# Patient Record
Sex: Male | Born: 1990 | Race: Black or African American | Hispanic: No | Marital: Single | Smoking: Current some day smoker
Health system: Southern US, Community
[De-identification: ages and names within clinical notes are randomized; demographics above are authoritative.]

## PROBLEM LIST (undated history)

## (undated) DIAGNOSIS — M1990 Unspecified osteoarthritis, unspecified site: Secondary | ICD-10-CM

## (undated) DIAGNOSIS — F319 Bipolar disorder, unspecified: Secondary | ICD-10-CM

## (undated) HISTORY — PX: ANTERIOR CRUCIATE LIGAMENT REPAIR: SHX115

---

## 2014-10-01 ENCOUNTER — Encounter (HOSPITAL_COMMUNITY): Payer: Self-pay

## 2014-10-01 ENCOUNTER — Emergency Department (HOSPITAL_COMMUNITY)
Admission: EM | Admit: 2014-10-01 | Discharge: 2014-10-01 | Disposition: A | Discharge status: Other Acute Inpatient Hospital | Payer: Self-pay | Attending: Emergency Medicine | Admitting: Emergency Medicine

## 2014-10-01 ENCOUNTER — Encounter (HOSPITAL_COMMUNITY): Payer: Self-pay | Admitting: Emergency Medicine

## 2014-10-01 ENCOUNTER — Inpatient Hospital Stay (HOSPITAL_COMMUNITY)
Admission: AD | Admit: 2014-10-01 | Discharge: 2014-10-02 | DRG: 881 | Disposition: A | Discharge status: Home / Self Care | Payer: 59 | Source: Intra-hospital | Attending: Psychiatry | Admitting: Psychiatry

## 2014-10-01 DIAGNOSIS — Z599 Problem related to housing and economic circumstances, unspecified: Secondary | ICD-10-CM

## 2014-10-01 DIAGNOSIS — F332 Major depressive disorder, recurrent severe without psychotic features: Secondary | ICD-10-CM | POA: Insufficient documentation

## 2014-10-01 DIAGNOSIS — F329 Major depressive disorder, single episode, unspecified: Secondary | ICD-10-CM | POA: Diagnosis present

## 2014-10-01 DIAGNOSIS — F4323 Adjustment disorder with mixed anxiety and depressed mood: Secondary | ICD-10-CM | POA: Diagnosis present

## 2014-10-01 DIAGNOSIS — F411 Generalized anxiety disorder: Secondary | ICD-10-CM | POA: Diagnosis present

## 2014-10-01 DIAGNOSIS — F17200 Nicotine dependence, unspecified, uncomplicated: Secondary | ICD-10-CM | POA: Diagnosis present

## 2014-10-01 DIAGNOSIS — F410 Panic disorder [episodic paroxysmal anxiety] without agoraphobia: Secondary | ICD-10-CM | POA: Diagnosis present

## 2014-10-01 DIAGNOSIS — T39312A Poisoning by propionic acid derivatives, intentional self-harm, initial encounter: Secondary | ICD-10-CM | POA: Insufficient documentation

## 2014-10-01 DIAGNOSIS — T1491XA Suicide attempt, initial encounter: Secondary | ICD-10-CM | POA: Diagnosis present

## 2014-10-01 DIAGNOSIS — G4700 Insomnia, unspecified: Secondary | ICD-10-CM | POA: Diagnosis present

## 2014-10-01 DIAGNOSIS — Z23 Encounter for immunization: Secondary | ICD-10-CM | POA: Diagnosis not present

## 2014-10-01 DIAGNOSIS — F322 Major depressive disorder, single episode, severe without psychotic features: Secondary | ICD-10-CM | POA: Insufficient documentation

## 2014-10-01 DIAGNOSIS — F321 Major depressive disorder, single episode, moderate: Secondary | ICD-10-CM

## 2014-10-01 DIAGNOSIS — Z720 Tobacco use: Secondary | ICD-10-CM | POA: Insufficient documentation

## 2014-10-01 DIAGNOSIS — F1290 Cannabis use, unspecified, uncomplicated: Secondary | ICD-10-CM | POA: Diagnosis present

## 2014-10-01 DIAGNOSIS — F1210 Cannabis abuse, uncomplicated: Secondary | ICD-10-CM | POA: Insufficient documentation

## 2014-10-01 LAB — RAPID URINE DRUG SCREEN, HOSP PERFORMED
AMPHETAMINES: NOT DETECTED
Barbiturates: NOT DETECTED
Benzodiazepines: NOT DETECTED
COCAINE: NOT DETECTED
Opiates: NOT DETECTED
Tetrahydrocannabinol: POSITIVE — AB

## 2014-10-01 LAB — BASIC METABOLIC PANEL
ANION GAP: 15 (ref 5–15)
BUN: 22 mg/dL (ref 6–23)
CHLORIDE: 102 meq/L (ref 96–112)
CO2: 21 meq/L (ref 19–32)
CREATININE: 1.02 mg/dL (ref 0.50–1.35)
Calcium: 9.4 mg/dL (ref 8.4–10.5)
GFR calc Af Amer: >90 mL/min (ref >90)
GFR calc non Af Amer: >90 mL/min (ref >90)
Glucose, Bld: 95 mg/dL (ref 70–99)
Potassium: 3.6 mEq/L — ABNORMAL LOW (ref 3.7–5.3)
Sodium: 138 mEq/L (ref 137–147)

## 2014-10-01 LAB — ETHANOL: Alcohol, Ethyl (B): <11 mg/dL (ref 0–11)

## 2014-10-01 LAB — ACETAMINOPHEN LEVEL: Acetaminophen (Tylenol), Serum: <15 ug/mL (ref 10–30)

## 2014-10-01 LAB — CBC
HEMATOCRIT: 37.8 % — AB (ref 39.0–52.0)
Hemoglobin: 12.6 g/dL — ABNORMAL LOW (ref 13.0–17.0)
MCH: 21.6 pg — ABNORMAL LOW (ref 26.0–34.0)
MCHC: 33.3 g/dL (ref 30.0–36.0)
MCV: 64.8 fL — ABNORMAL LOW (ref 78.0–100.0)
Platelets: 233 10*3/uL (ref 150–400)
RBC: 5.83 MIL/uL — ABNORMAL HIGH (ref 4.22–5.81)
RDW: 15.9 % — AB (ref 11.5–15.5)
WBC: 7.7 10*3/uL (ref 4.0–10.5)

## 2014-10-01 LAB — SALICYLATE LEVEL: Salicylate Lvl: <2 mg/dL — ABNORMAL LOW (ref 2.8–20.0)

## 2014-10-01 MED ORDER — ACETAMINOPHEN 325 MG PO TABS: 650.0000 mg | ORAL_TABLET | Freq: Four times a day (QID) | ORAL | Status: DC | PRN

## 2014-10-01 MED ORDER — TRAZODONE HCL 50 MG PO TABS
50.0000 mg | ORAL_TABLET | Freq: Every evening | ORAL | Status: DC | PRN
  Administered 2014-10-01 – 2014-10-02 (×2): 50 mg via ORAL
  Filled 2014-10-01 (×6): qty 1

## 2014-10-01 MED ORDER — HYDROXYZINE HCL 25 MG PO TABS
25.0000 mg | ORAL_TABLET | Freq: Four times a day (QID) | ORAL | Status: DC | PRN
  Administered 2014-10-02: 25 mg via ORAL
  Filled 2014-10-01: qty 30
  Filled 2014-10-01: qty 1

## 2014-10-01 MED ORDER — INFLUENZA VAC SPLIT QUAD 0.5 ML IM SUSY
0.5000 mL | PREFILLED_SYRINGE | INTRAMUSCULAR | Status: AC
  Administered 2014-10-02: 0.5 mL via INTRAMUSCULAR
  Filled 2014-10-01: qty 0.5

## 2014-10-01 MED ORDER — LORAZEPAM 1 MG PO TABS
ORAL_TABLET | ORAL | Status: AC
  Filled 2014-10-01: qty 2

## 2014-10-01 MED ORDER — TRAZODONE HCL 50 MG PO TABS
ORAL_TABLET | ORAL | Status: AC
  Filled 2014-10-01: qty 1

## 2014-10-01 MED ORDER — MAGNESIUM HYDROXIDE 400 MG/5ML PO SUSP: 30.0000 mL | Freq: Every day | ORAL | Status: DC | PRN

## 2014-10-01 MED ORDER — LORAZEPAM 1 MG PO TABS
2.0000 mg | ORAL_TABLET | Freq: Once | ORAL | Status: AC
  Administered 2014-10-01: 2 mg via ORAL

## 2014-10-01 MED ORDER — ALUM & MAG HYDROXIDE-SIMETH 200-200-20 MG/5ML PO SUSP: 30.0000 mL | ORAL | Status: DC | PRN

## 2014-10-01 NOTE — BH Assessment (Signed)
Assessment Note  Adam Benton is an 23 y.o. male.  -Clinician talked with Sharen Hones, NP regarding need for TTS consult.  Patient had taken an overdose of 7 alieve tablets with ETOH and stated to police that he intended to kill himself.  Patient did admit to wanting to kill himself when he took the seven alieve tablets.  When asked if he was still feeling suicidal, patient says "sometimes."  He has been feeling very depressed for the last year.  Patient says that he gets upset with his baby's mother and her family.  "They blame everything on me."  Patient said that he doesn't want to harm anyone else, just himself.  Patient denies any A/V hallucinations.  Patient says he did drink "a fourth of a bottle" of ETOH when he took the alieve tablets.  He reports not normally drinking but 1-2 times per month and only a shot or so then.  Daily use of marijuana.  Usually smokes two joints per day.  Patient denies other drug use.  Patient has no current psychiatric provider and no previous inpatient care.  Patient has a court date coming up soon due to probation violation a few weeks ago.  Patient care was discussed with Donell Sievert, PA.  Patient has been accepted once his labs are back and reviewed again.  UDS is not taken yet.  Clinician informed Elsie Stain, NP of this disposition.  Axis I: 296.23 MDD Single episode, severe Axis II: Deferred Axis III: History reviewed. No pertinent past medical history. Axis IV: housing problems, occupational problems, problems related to legal system/crime and problems with primary support group Axis V: 31-40 impairment in reality testing  Past Medical History: History reviewed. No pertinent past medical history.  History reviewed. No pertinent past surgical history.  Family History: History reviewed. No pertinent family history.  Social History:  reports that he has been smoking.  He does not have any smokeless tobacco history on file. He reports that he  drinks alcohol. He reports that he uses illicit drugs (Marijuana).  Additional Social History:  Alcohol / Drug Use Pain Medications: None Prescriptions: None Over the Counter: N/A History of alcohol / drug use?: Yes Substance #1 Name of Substance 1: Marijuana 1 - Age of First Use: 23 years of age 9 - Amount (size/oz): Two joints per day 1 - Frequency: Daily use 1 - Duration: On-going 1 - Last Use / Amount: 10/27 Substance #2 Name of Substance 2: ETOH 2 - Age of First Use: 23 years of age 28 - Amount (size/oz): <1 shot on average1-2x/M 2 - Frequency: 1-2 times per month 2 - Duration: ongoing 2 - Last Use / Amount: 10/27 did drink "1/4 of a bottle."  CIWA: CIWA-Ar BP: 122/70 mmHg Pulse Rate: 94 COWS:    Allergies: No Known Allergies  Home Medications:  (Not in a hospital admission)  OB/GYN Status:  No LMP for male patient.  General Assessment Data Location of Assessment: [facility] ACT Assessment: Yes Is this a Tele or Face-to-Face Assessment?: Face-to-Face Is this an Initial Assessment or a Re-assessment for this encounter?: Initial Assessment Living Arrangements: Other relatives (PGM) Can pt return to current living arrangement?: Yes Admission Status: Voluntary Is patient capable of signing voluntary admission?: Yes Transfer from: Acute Hospital Referral Source: Self/Family/Friend     [facility] Crisis Care Plan Living Arrangements: Other relatives (PGM) Name of Psychiatrist: None Name of Therapist: NOne     Risk to self with the past 6 months Suicidal Ideation:  Yes-Currently Present Suicidal Intent: Yes-Currently Present Is patient at risk for suicide?: Yes Suicidal Plan?: Yes-Currently Present Specify Current Suicidal Plan: Overdose on OTC meds Access to Means: Yes Specify Access to Suicidal Means: OTC meds What has been your use of drugs/alcohol within the last 12 months?: THC daily Previous Attempts/Gestures: No How many times?: 0 Other Self Harm Risks:  None Triggers for Past Attempts: None known Intentional Self Injurious Behavior: None Family Suicide History: No Recent stressful life event(s): Conflict (Comment) (Conflict w/ baby mother's family and with her.) Persecutory voices/beliefs?: Yes Depression: Yes Depression Symptoms: Despondent;Insomnia;Isolating;Guilt;Loss of interest in usual pleasures;Feeling worthless/self pity Substance abuse history and/or treatment for substance abuse?: Yes Suicide prevention information given to non-admitted patients: Not applicable  Risk to Others within the past 6 months Homicidal Ideation: No Thoughts of Harm to Others: No Current Homicidal Intent: No Current Homicidal Plan: No Access to Homicidal Means: No Identified Victim: No one History of harm to others?: No Assessment of Violence: None Noted Violent Behavior Description: None Does patient have access to weapons?: No Criminal Charges Pending?: Yes Describe Pending Criminal Charges: Probation violation Does patient have a court date: Yes Court Date:  (Pt said his appearance is on 11/02)  Psychosis Hallucinations: None noted Delusions: None noted  Mental Status Report Appear/Hygiene: Unremarkable;In scrubs Eye Contact: Fair Motor Activity: Freedom of movement;Unremarkable Speech: Logical/coherent Level of Consciousness: Quiet/awake Mood: Depressed;Despair;Empty;Sad Affect: Anxious;Blunted;Sad Anxiety Level: Panic Attacks Panic attack frequency: 3-4x/W Most recent panic attack: A few days ago Thought Processes: Coherent;Relevant Judgement: Unimpaired Orientation: Person;Place;Time;Situation Obsessive Compulsive Thoughts/Behaviors: None  Cognitive Functioning Concentration: Decreased Memory: Recent Intact;Remote Intact IQ: Average Insight: Fair Impulse Control: Poor Appetite: Poor Weight Loss: 0 Weight Gain: 0 Sleep: Decreased Total Hours of Sleep:  (<4H/D) Vegetative Symptoms: Staying in bed  ADLScreening [facility]  Assessment Services) Patient's cognitive ability adequate to safely complete daily activities?: Yes Patient able to express need for assistance with ADLs?: Yes Independently performs ADLs?: Yes (appropriate for developmental age)  Prior Inpatient Therapy Prior Inpatient Therapy: No Prior Therapy Dates: None Prior Therapy Facilty/Provider(s): N/A Reason for Treatment: N/A  Prior Outpatient Therapy Prior Outpatient Therapy: No Prior Therapy Dates: N/A Prior Therapy Facilty/Provider(s): N/A Reason for Treatment: N/A  ADL Screening (condition at time of admission) Patient's cognitive ability adequate to safely complete daily activities?: Yes Is the patient deaf or have difficulty hearing?: No Does the patient have difficulty seeing, even when wearing glasses/contacts?: No Does the patient have difficulty concentrating, remembering, or making decisions?: No Patient able to express need for assistance with ADLs?: Yes Does the patient have difficulty dressing or bathing?: No Independently performs ADLs?: Yes (appropriate for developmental age) Does the patient have difficulty walking or climbing stairs?: No Weakness of Legs: None Weakness of Arms/Hands: None       Abuse/Neglect Assessment (Assessment to be complete while patient is alone) Physical Abuse: Denies Verbal Abuse: Denies Sexual Abuse: Denies Exploitation of patient/patient's resources: Denies Self-Neglect: Denies Values / Beliefs Cultural Requests During Hospitalization: None Spiritual Requests During Hospitalization: None   Advance Directives (For Healthcare) Does patient have an advance directive?: No Would patient like information on creating an advanced directive?: No - patient declined information    Additional Information 1:1 In Past 12 Months?: No CIRT Risk: No Elopement Risk: No Does patient have medical clearance?: Yes     Disposition:  Disposition Initial Assessment Completed for this Encounter:  Yes Disposition of Patient: Inpatient treatment program;Referred to Type of inpatient treatment program: Adult Patient referred to:  (  Pt meets inpatient criteria.per Donell SievertSpencer Simon)  On Site Evaluation by:   Reviewed with Physician:    Alexandria LodgeHarvey, [place] Espina Ray 10/01/2014 5:55 AM

## 2014-10-01 NOTE — ED Notes (Signed)
Pt transported from home after reports of taking 7 Aleve tablets @ 1 hour ago. Pt states to [employer] this was a suicide attempt. Pt is A & O. +etoh.

## 2014-10-01 NOTE — Tx Team (Signed)
Initial Interdisciplinary Treatment Plan   PATIENT STRESSORS: Educational concerns Medication change or noncompliance Substance abuse   PROBLEM LIST: Problem List/Patient Goals Date to be addressed Date deferred Reason deferred Estimated date of resolution  Depression with suicidal gesture                                                       DISCHARGE CRITERIA:  Improved stabilization in mood, thinking, and/or behavior Need for constant or close observation no longer present Verbal commitment to aftercare and medication compliance  PRELIMINARY DISCHARGE PLAN: Attend aftercare/continuing care group Outpatient therapy Return to previous work or school arrangements  PATIENT/FAMIILY INVOLVEMENT: This treatment plan has been presented to and reviewed with the patient, Adam Benton, and/or family member, .  The patient and family have been given the opportunity to ask questions and make suggestions.  Andrena Mewsuttall, Cova Knieriem J 10/01/2014, 11:26 PM

## 2014-10-01 NOTE — ED Notes (Signed)
Pt transported to [facility] by [facility] transportation service for continuation of specialized care. He left in no acute distress. 

## 2014-10-01 NOTE — ED Provider Notes (Signed)
CSN: [id number]     Arrival date & time 10/01/14  0122 History   First MD Initiated Contact with Patient 10/01/14 0129     Chief Complaint  Patient presents with  . Suicide Attempt     (Consider location/radiation/quality/duration/timing/severity/associated sxs/prior Treatment) HPI Comments: Has been arguing with his "babies momma' and thing got to be too much so he thought it would be better if he just ended it -- he had been during throughout the days and tonight took 7 Aleve tablets.  Denies previous Hx SI but states he has thought about it in the past   The history is provided by the patient.    History reviewed. No pertinent past medical history. History reviewed. No pertinent past surgical history. History reviewed. No pertinent family history. History  Substance Use Topics  . Smoking status: Current Some Day Smoker  . Smokeless tobacco: Not on file  . Alcohol Use: Yes     Comment: Socially    Review of Systems  Constitutional: Negative for fever.  Gastrointestinal: Negative for nausea and abdominal pain.  Neurological: Negative for dizziness and headaches.  Psychiatric/Behavioral: Positive for suicidal ideas.  All other systems reviewed and are negative.     Allergies  Review of patient's allergies indicates no known allergies.  Home Medications   Prior to Admission medications   Medication Sig Start Date End Date Taking? Authorizing Provider  naproxen sodium (ANAPROX) 220 MG tablet Take 1,540 mg by mouth once.   Yes Historical Provider, MD   BP 109/63  Pulse 64  Temp(Src) 98.3 F (36.8 C) (Oral)  Resp 18  Ht 5\' 9"  (1.753 m)  Wt 162 lb (73.483 kg)  BMI 23.91 kg/m2  SpO2 100% Physical Exam  Nursing note and vitals reviewed. Constitutional: He appears well-nourished.  HENT:  Head: Normocephalic.  Eyes: Pupils are equal, round, and reactive to light.  Neck: Normal range of motion.  Cardiovascular: Normal rate and regular rhythm.   Pulmonary/Chest:  Effort normal and breath sounds normal.  Abdominal: Soft. He exhibits no distension. There is no tenderness.  Musculoskeletal: Normal range of motion.  Neurological: He is alert.  Skin: Skin is warm. No rash noted.  Psychiatric: His speech is normal and behavior is normal. Cognition and memory are normal. He expresses impulsivity. He exhibits a depressed mood. He expresses suicidal ideation. He expresses suicidal plans.    ED Course  Procedures (including critical care time) Labs Review Labs Reviewed  CBC - Abnormal; Notable for the following:    RBC 5.83 (*)    Hemoglobin 12.6 (*)    HCT 37.8 (*)    MCV 64.8 (*)    MCH 21.6 (*)    RDW 15.9 (*)    All other components within normal limits  SALICYLATE LEVEL - Abnormal; Notable for the following:    Salicylate Lvl <2.0 (*)    All other components within normal limits  BASIC METABOLIC PANEL - Abnormal; Notable for the following:    Potassium 3.6 (*)    All other components within normal limits  URINE RAPID DRUG SCREEN (HOSP PERFORMED) - Abnormal; Notable for the following:    Tetrahydrocannabinol POSITIVE (*)    All other components within normal limits  ETHANOL  ACETAMINOPHEN LEVEL    Imaging Review No results found.   EKG Interpretation None      MDM   Final diagnoses:  MDD (major depressive disorder), severe  Suicide attempt         Arman FilterGail K Zohan Shiflet,  NP 10/01/14 1951

## 2014-10-01 NOTE — Consult Note (Signed)
Face to face evaluation and I agree with this note 

## 2014-10-01 NOTE — Progress Notes (Signed)
Pt is a 23 year old male admitted with depression after overdosing on seven alleve tablets    He reported that the stress of school and being a father was too much  He also said he smokes pot to help him not to feel suicidal and he didn't smoke any that day   He appears anxious and reports having poor sleep   Pt has not had treatment before   He is pleasant and cooperative   Admission assessment completed and nourishment offered   Q 15 min checks explained and implemented   Verbal support given  Administered medications as ordered  Discussed other alternatives to help sleep  Pt safe at present

## 2014-10-01 NOTE — ED Notes (Signed)
Bed: [id number] Expected date:  Expected time:  Means of arrival:  Comments: EMS Naproxen OD, SI

## 2014-10-01 NOTE — ED Notes (Signed)
Pt reports x4 months of depression and intermittent SI - pt denies any psych hx - admits he has been drinking liquor since yesterday approx 1800 and then took x7 200mg  Aleve in an attempt to overdose - pt states he got into an argument w/ someone yesterday evening and it carried over into today. Pt pleasant and cooperative - no acute distress.

## 2014-10-01 NOTE — Consult Note (Signed)
  Patient lying on bed.  Patient would not participate in interview.  After asking the patient several questions patient responded by shaking his head and saying "I don't want to talk right now."  Informed patient needed to answer the questions to that a plan could be made as far as his care.  Patient just sat staring.  Will attempt to assess at a later date.    Caylin Nass B. Lukas Pelcher FNP-BC

## 2014-10-02 DIAGNOSIS — F411 Generalized anxiety disorder: Secondary | ICD-10-CM

## 2014-10-02 DIAGNOSIS — F329 Major depressive disorder, single episode, unspecified: Secondary | ICD-10-CM | POA: Diagnosis present

## 2014-10-02 DIAGNOSIS — F4323 Adjustment disorder with mixed anxiety and depressed mood: Secondary | ICD-10-CM | POA: Diagnosis present

## 2014-10-02 MED ORDER — TRAZODONE HCL 50 MG PO TABS: 50.0000 mg | ORAL_TABLET | Freq: Every evening | ORAL | Status: AC | PRN

## 2014-10-02 MED ORDER — HYDROXYZINE HCL 25 MG PO TABS: 25.0000 mg | ORAL_TABLET | Freq: Four times a day (QID) | ORAL | Status: AC | PRN

## 2014-10-02 NOTE — Progress Notes (Signed)
[facility] :  Will you be returning to the same living situation after discharge: Yes,  patient will return home with his grandmother At discharge, do you have transportation home?:Yes,  patient reports access to transportation Do you have the ability to pay for your medications:Yes,  patient will be provided with medication samples and prescriptions at discharge.  Release of information consent forms completed and in the chart;  Patient's signature needed at discharge.  Patient to Follow up at: Follow-up Information   [facility] ([facility] this date or any time Monday-Friday between 8am to 3pm for assessment for therapy services. )    [facility]   Contact information:   [address] [place] [address] [phone]       Patient denies SI/HI:   Yes,  denies    Safety Planning and Suicide Prevention discussed:  Yes,  with patient and grandmother  Adam Benton, Adam Benton 10/02/2014, 1:57 PM

## 2014-10-02 NOTE — [facility] Suicide Risk Assessment (Signed)
Suicide Risk Assessment  Discharge Assessment     Demographic Factors:  Male and Adolescent or young adult  Total Time spent with patient: 45 minutes  Psychiatric Specialty Exam:     Blood pressure 105/58, pulse 86, temperature 97.5 F (36.4 C), temperature source Oral, resp. rate 18, height 5\' 8"  (1.727 m), weight 69.4 kg (153 lb), SpO2 100.00%.Body mass index is 23.27 kg/(m^2).  General Appearance: Fairly Groomed  Patent attorneyye Contact::  Fair  Speech:  Clear and Coherent  Volume:  Normal  Mood:  worries  Affect:  Appropriate  Thought Process:  Coherent and Goal Directed  Orientation:  Full (Time, Place, and Person)  Thought Content:  plans as he moves on  Suicidal Thoughts:  No  Homicidal Thoughts:  No  Memory:  Immediate;   Fair Recent;   Fair Remote;   Fair  Judgement:  Fair  Insight:  Present  Psychomotor Activity:  Normal  Concentration:  Fair  Recall:  FiservFair  Fund of Knowledge:NA  Language: Fair  Akathisia:  No  Handed:    AIMS (if indicated):     Assets:  Desire for Improvement Housing Physical Health Social Support Vocational/Educational  Sleep:  Number of Hours: 1.5    Musculoskeletal: Strength & Muscle Tone: within normal limits Gait & Station: normal Patient leans: N/A   Mental Status Per Nursing Assessment::   On Admission:     Current Mental Status by Physician: In full contact with reality. There are no active SI plans or intent. States he is ready to go back home. Looking forward to being with his daughter and getting back in school   Loss Factors: NA  Historical Factors: NA  Risk Reduction Factors:   Responsible for children under 718 years of age, Sense of responsibility to family, Living with another person, especially a relative and Positive social support  Continued Clinical Symptoms:  Depression:   Impulsivity  Cognitive Features That Contribute To Risk: none identified   Suicide Risk:  Minimal: No identifiable suicidal ideation.   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 No pertinent past medical history. AXIS IV:  other psychosocial or environmental problems AXIS V:  61-70 mild symptoms  Plan Of Care/Follow-up recommendations:  Activity:  as tolerated Diet:  regular Follow up outpatient basis Is patient on multiple antipsychotic therapies at discharge:  No   Has Patient had three or more failed trials of antipsychotic monotherapy by history:  No  Recommended Plan for Multiple Antipsychotic Therapies: NA    Adam Benton A 10/02/2014, 3:10 PM
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
## 2014-10-02 NOTE — [facility] Group Notes (Signed)
[facility] Group Notes:  (Nursing/MHT/Case Management/Adjunct)  Date:  10/02/2014  Time:  0900 am  Type of Therapy:  Psychoeducational Skills  Participation Level:  Minimal  Participation Quality:  Appropriate  Affect:  Appropriate  Cognitive:  Appropriate  Insight:  Appropriate and Good  Engagement in Group:  Limited  Modes of Intervention:  Support  Summary of Progress/Problems: Patient came into group late.  Cranford MonBeaudry, Zanasia Hickson Evans 10/02/2014, 12:59 PM
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
## 2014-10-02 NOTE — [facility] Suicide Risk Assessment (Signed)
[facility] INPATIENT:  Family/Significant Other Suicide Prevention Education  Suicide Prevention Education:  Education Completed; grandmother Gwendel HansonMae Brasher [phone],  (name of family member/significant other) has been identified by the patient as the family member/significant other with whom the patient will be residing, and identified as the person(s) who will aid the patient in the event of a mental health crisis (suicidal ideations/suicide attempt).  With written consent from the patient, the family member/significant other has been provided the following suicide prevention education, prior to the and/or following the discharge of the patient.  The suicide prevention education provided includes the following:  Suicide risk factors  Suicide prevention and interventions  National Suicide Hotline telephone number  [facility] assessment telephone number  [facility] [place] Emergency Assistance 911  [facility] and/or Residential Mobile Crisis Unit telephone number  Request made of family/significant other to:  Remove weapons (e.g., guns, rifles, knives), all items previously/currently identified as safety concern.    Remove drugs/medications (over-the-counter, prescriptions, illicit drugs), all items previously/currently identified as a safety concern.  The family member/significant other verbalizes understanding of the suicide prevention education information provided.  The family member/significant other agrees to remove the items of safety concern listed above.  Hadi Dubin, [place] CarboKristin L 10/02/2014, 1:55 PM

## 2014-10-02 NOTE — [facility] Group Notes (Signed)
[facility] LCSW Group Therapy  [facility] [place] [facility] 1:15 - 2:30 PM  10/02/2014   Type of Therapy:  Group Therapy  Participation Level: Active  Participation Quality:  Attentive  Affect:  Appropriate  Cognitive:  Appropriate  Insight:  Developing/Improving Engaged  Engagement in Therapy:  Developing/Improving   Modes of Intervention:  Discussion, Education, Exploration, Problem-Solving, Rapport Building, Support   Summary of Progress/Problems:   Patient was attentive to speaker from the Mental [facility] Association as he shared his story of dealing with mental health/substance abuse issues and overcoming it by working a recovery program.  Patient was very engaged in speaker's presentation and shook speaker hand and thanked him for coming.  Patient expressed interest in their programs and services and received information on their agency.    Adam Benton, Adam Benton 10/02/2014

## 2014-10-02 NOTE — Progress Notes (Signed)
Patient ID: Adam RiversShawn Benton, male   DOB: 03/10/1991, 23 y.o.   MRN: [id number] Nursing discharge note:  Patient discharged home per MD order.  Patient received all personal belongings, prescriptions and medication samples.  He denies any SI/HI/AVH.  Patient's discharge instructions, medications and follow up appointment reviewed with patient and he indicated understanding.  Patient left ambulatory with his girlfriend.

## 2014-10-02 NOTE — Clinical Social Work Note (Signed)
CSW attempted to contact patient's grandmother Gwendel HansonMae Estock [phone], left voicemail. Awaiting return call.  Samuella BruinKristin Falesha Schommer, MSW, [employer]CSWA Clinical Social Worker [facility] [phone]

## 2014-10-02 NOTE — Progress Notes (Signed)
D: Patient is alert and oriented. Pt's mood is "great." Pt's affect is appropriate and pleasant. Pt denies SI/HI and AVH at this time. Pt is attending groups. Pt reports his goal for the day is to be "happy" by "stay[ing] positive." Pt reports depression 0/10, hopelessness 6/10, and anxiety 10/10. A: Medications administered per providers orders (See MAR). 15 minute checks completed per protocol for patient safety. R: Pt receptive and cooperative to nursing interventions.

## 2014-10-03 NOTE — ED Provider Notes (Signed)
Medical screening examination/treatment/procedure(s) were conducted as a shared visit with non-physician practitioner(s) and myself.  I personally evaluated the patient during the encounter.  Pt with depression, SI. Also states took 5-7 aleve. No gi upset, no abd pain or nv. Labs. Psych team consulted. Pt alert, content, no resp dep or alt ms. Awaiting psych eval.    Suzi RootsKevin E Shakisha Abend, MD 10/03/14 [phone]
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
## 2014-10-07 NOTE — Progress Notes (Signed)
Patient Discharge Instructions:  After Visit Summary (AVS):   Faxed to:  10/07/14 Discharge Summary Note:   Faxed to:  10/07/14 Psychiatric Admission Assessment Note:   Faxed to:  10/07/14 Suicide Risk Assessment - Discharge Assessment:   Faxed to:  10/07/14 Faxed/Sent to the Next Level Care provider:  10/07/14 [facility] @ [phone]  Jerelene ReddenSheena E [place], 10/07/2014, 1:54 PM

## 2015-03-19 ENCOUNTER — Emergency Department (HOSPITAL_COMMUNITY): Payer: Self-pay

## 2015-03-19 ENCOUNTER — Encounter (HOSPITAL_COMMUNITY): Payer: Self-pay | Admitting: *Deleted

## 2015-03-19 ENCOUNTER — Emergency Department (HOSPITAL_COMMUNITY)
Admission: EM | Admit: 2015-03-19 | Discharge: 2015-03-19 | Disposition: A | Discharge status: Home / Self Care | Payer: Self-pay | Attending: Emergency Medicine | Admitting: Emergency Medicine

## 2015-03-19 DIAGNOSIS — Z720 Tobacco use: Secondary | ICD-10-CM | POA: Insufficient documentation

## 2015-03-19 DIAGNOSIS — M25461 Effusion, right knee: Secondary | ICD-10-CM | POA: Insufficient documentation

## 2015-03-19 DIAGNOSIS — M2550 Pain in unspecified joint: Secondary | ICD-10-CM

## 2015-03-19 DIAGNOSIS — R079 Chest pain, unspecified: Secondary | ICD-10-CM | POA: Insufficient documentation

## 2015-03-19 DIAGNOSIS — M25469 Effusion, unspecified knee: Secondary | ICD-10-CM

## 2015-03-19 LAB — HEPATIC FUNCTION PANEL
ALT: 13 U/L (ref 0–53)
AST: 18 U/L (ref 0–37)
Albumin: 4 g/dL (ref 3.5–5.2)
Alkaline Phosphatase: 52 U/L (ref 39–117)
Bilirubin, Direct: 0.1 mg/dL (ref 0.0–0.5)
Indirect Bilirubin: 0.3 mg/dL (ref 0.3–0.9)
Total Bilirubin: 0.4 mg/dL (ref 0.3–1.2)
Total Protein: 6.9 g/dL (ref 6.0–8.3)

## 2015-03-19 LAB — CBC WITH DIFFERENTIAL/PLATELET
Basophils Absolute: 0 K/uL (ref 0.0–0.1)
Basophils Relative: 0 % (ref 0–1)
Eosinophils Absolute: 0.2 K/uL (ref 0.0–0.7)
Eosinophils Relative: 3 % (ref 0–5)
HCT: 36.9 % — ABNORMAL LOW (ref 39.0–52.0)
Hemoglobin: 12.7 g/dL — ABNORMAL LOW (ref 13.0–17.0)
Lymphocytes Relative: 26 % (ref 12–46)
Lymphs Abs: 2 K/uL (ref 0.7–4.0)
MCH: 21.9 pg — ABNORMAL LOW (ref 26.0–34.0)
MCHC: 34.4 g/dL (ref 30.0–36.0)
MCV: 63.7 fL — ABNORMAL LOW (ref 78.0–100.0)
Monocytes Absolute: 0.9 K/uL (ref 0.1–1.0)
Monocytes Relative: 12 % (ref 3–12)
Neutro Abs: 4.4 K/uL (ref 1.7–7.7)
Neutrophils Relative %: 59 % (ref 43–77)
Platelets: 226 K/uL (ref 150–400)
RBC: 5.79 MIL/uL (ref 4.22–5.81)
RDW: 15.9 % — ABNORMAL HIGH (ref 11.5–15.5)
WBC: 7.5 K/uL (ref 4.0–10.5)

## 2015-03-19 LAB — GC/CHLAMYDIA PROBE AMP (~~LOC~~) NOT AT ARMC
Chlamydia: NEGATIVE
Neisseria Gonorrhea: NEGATIVE

## 2015-03-19 LAB — URINALYSIS, ROUTINE W REFLEX MICROSCOPIC
Bilirubin Urine: NEGATIVE
Glucose, UA: NEGATIVE mg/dL
Hgb urine dipstick: NEGATIVE
Ketones, ur: 15 mg/dL — AB
Leukocytes, UA: NEGATIVE
Nitrite: NEGATIVE
Protein, ur: NEGATIVE mg/dL
Specific Gravity, Urine: 1.035 — ABNORMAL HIGH (ref 1.005–1.030)
Urobilinogen, UA: 0.2 mg/dL (ref 0.0–1.0)
pH: 5.5 (ref 5.0–8.0)

## 2015-03-19 LAB — SYNOVIAL CELL COUNT + DIFF, W/ CRYSTALS
Crystals, Fluid: NONE SEEN
Eosinophils-Synovial: 0 % (ref 0–1)
Lymphocytes-Synovial Fld: 4 % (ref 0–20)
Monocyte-Macrophage-Synovial Fluid: 89 % (ref 50–90)
Neutrophil, Synovial: 7 % (ref 0–25)
WBC, Synovial: 5016 /mm3 — ABNORMAL HIGH (ref 0–200)

## 2015-03-19 LAB — BASIC METABOLIC PANEL WITH GFR
Anion gap: 11 (ref 5–15)
BUN: 11 mg/dL (ref 6–23)
CO2: 20 mmol/L (ref 19–32)
Calcium: 8.9 mg/dL (ref 8.4–10.5)
Chloride: 108 mmol/L (ref 96–112)
Creatinine, Ser: 0.85 mg/dL (ref 0.50–1.35)
GFR calc Af Amer: >90 mL/min
GFR calc non Af Amer: >90 mL/min
Glucose, Bld: 93 mg/dL (ref 70–99)
Potassium: 3.5 mmol/L (ref 3.5–5.1)
Sodium: 139 mmol/L (ref 135–145)

## 2015-03-19 LAB — HIV ANTIBODY (ROUTINE TESTING W REFLEX): HIV Screen 4th Generation wRfx: NONREACTIVE

## 2015-03-19 LAB — I-STAT TROPONIN, ED: Troponin i, poc: 0 ng/mL (ref 0.00–0.08)

## 2015-03-19 LAB — SEDIMENTATION RATE: Sed Rate: 4 mm/h (ref 0–16)

## 2015-03-19 MED ORDER — IBUPROFEN 800 MG PO TABS
800.0000 mg | ORAL_TABLET | Freq: Once | ORAL | Status: AC
  Administered 2015-03-19: 800 mg via ORAL
  Filled 2015-03-19: qty 2

## 2015-03-19 MED ORDER — IBUPROFEN 600 MG PO TABS: 600.0000 mg | ORAL_TABLET | Freq: Four times a day (QID) | ORAL | Status: DC | PRN

## 2015-03-19 MED ORDER — LIDOCAINE HCL (PF) 2 % IJ SOLN
10.0000 mL | Freq: Once | INTRAMUSCULAR | Status: AC
  Administered 2015-03-19: 10 mL
  Filled 2015-03-19: qty 10

## 2015-03-19 NOTE — ED Notes (Addendum)
Joint aspiration of left knee in process by Dr.Phifer

## 2015-03-19 NOTE — Discharge Instructions (Signed)
Arthralgia °Your caregiver has diagnosed you as suffering from an arthralgia. Arthralgia means there is pain in a joint. This can come from many reasons including: °· Bruising the joint which causes soreness (inflammation) in the joint. °· Wear and tear on the joints which occur as we grow older (osteoarthritis). °· Overusing the joint. °· Various forms of arthritis. °· Infections of the joint. °Regardless of the cause of pain in your joint, most of these different pains respond to anti-inflammatory drugs and rest. The exception to this is when a joint is infected, and these cases are treated with antibiotics, if it is a bacterial infection. °HOME CARE INSTRUCTIONS  °· Rest the injured area for as long as directed by your caregiver. Then slowly start using the joint as directed by your caregiver and as the pain allows. Crutches as directed may be useful if the ankles, knees or hips are involved. If the knee was splinted or casted, continue use and care as directed. If an stretchy or elastic wrapping bandage has been applied today, it should be removed and re-applied every 3 to 4 hours. It should not be applied tightly, but firmly enough to keep swelling down. Watch toes and feet for swelling, bluish discoloration, coldness, numbness or excessive pain. If any of these problems (symptoms) occur, remove the ace bandage and re-apply more loosely. If these symptoms persist, contact your caregiver or return to this location. °· For the first 24 hours, keep the injured extremity elevated on pillows while lying down. °· Apply ice for 15-20 minutes to the sore joint every couple hours while awake for the first half day. Then 03-04 times per day for the first 48 hours. Put the ice in a plastic bag and place a towel between the bag of ice and your skin. °· Wear any splinting, casting, elastic bandage applications, or slings as instructed. °· Only take over-the-counter or prescription medicines for pain, discomfort, or fever as  directed by your caregiver. Do not use aspirin immediately after the injury unless instructed by your physician. Aspirin can cause increased bleeding and bruising of the tissues. °· If you were given crutches, continue to use them as instructed and do not resume weight bearing on the sore joint until instructed. °Persistent pain and inability to use the sore joint as directed for more than 2 to 3 days are warning signs indicating that you should see a caregiver for a follow-up visit as soon as possible. Initially, a hairline fracture (break in bone) may not be evident on X-rays. Persistent pain and swelling indicate that further evaluation, non-weight bearing or use of the joint (use of crutches or slings as instructed), or further X-rays are indicated. X-rays may sometimes not show a small fracture until a week or 10 days later. Make a follow-up appointment with your own caregiver or one to whom we have referred you. A radiologist (specialist in reading X-rays) may read your X-rays. Make sure you know how you are to obtain your X-ray results. Do not assume everything is normal if you do not hear from us. °SEEK MEDICAL CARE IF: °Bruising, swelling, or pain increases. °SEEK IMMEDIATE MEDICAL CARE IF:  °· Your fingers or toes are numb or blue. °· The pain is not responding to medications and continues to stay the same or get worse. °· The pain in your joint becomes severe. °· You develop a fever over 102° F (38.9° C). °· It becomes impossible to move or use the joint. °MAKE SURE YOU:  °·   Understand these instructions.  Will watch your condition.  Will get help right away if you are not doing well or get worse. Document Released: 11/21/2005 Document Revised: 02/13/2012 Document Reviewed: 07/09/2008 [facility] Patient Information 2015 [place], [place]. This information is not intended to replace advice given to you by your health care provider. Make sure you discuss any questions you have with your health care  provider. Knee Effusion The medical term for having fluid in your knee is effusion. This is often due to an internal derangement of the knee. This means something is wrong inside the knee. Some of the causes of fluid in the knee may be torn cartilage, a torn ligament, or bleeding into the joint from an injury. Your knee is likely more difficult to bend and move. This is often because there is increased pain and pressure in the joint. The time it takes for recovery from a knee effusion depends on different factors, including:   Type of injury.  Your age.  Physical and medical conditions.  Rehabilitation Strategies. How long you will be away from your normal activities will depend on what kind of knee problem you have and how much damage is present. Your knee has two types of cartilage. Articular cartilage covers the bone ends and lets your knee bend and move smoothly. Two menisci, thick pads of cartilage that form a rim inside the joint, help absorb shock and stabilize your knee. Ligaments bind the bones together and support your knee joint. Muscles move the joint, help support your knee, and take stress off the joint itself. CAUSES  Often an effusion in the knee is caused by an injury to one of the menisci. This is often a tear in the cartilage. Recovery after a meniscus injury depends on how much meniscus is damaged and whether you have damaged other knee tissue. Small tears may heal on their own with conservative treatment. Conservative means rest, limited weight bearing activity and muscle strengthening exercises. Your recovery may take up to 6 weeks.  TREATMENT  Larger tears may require surgery. Meniscus injuries may be treated during arthroscopy. Arthroscopy is a procedure in which your surgeon uses a small telescope like instrument to look in your knee. Your caregiver can make a more accurate diagnosis (learning what is wrong) by performing an arthroscopic procedure. If your injury is on the  inner margin of the meniscus, your surgeon may trim the meniscus back to a smooth rim. In other cases your surgeon will try to repair a damaged meniscus with stitches (sutures). This may make rehabilitation take longer, but may provide better long term result by helping your knee keep its shock absorption capabilities. Ligaments which are completely torn usually require surgery for repair. HOME CARE INSTRUCTIONS  Use crutches as instructed.  If a brace is applied, use as directed.  Once you are home, an ice pack applied to your swollen knee may help with discomfort and help decrease swelling.  Keep your knee raised (elevated) when you are not up and around or on crutches.  Only take over-the-counter or prescription medicines for pain, discomfort, or fever as directed by your caregiver.  Your caregivers will help with instructions for rehabilitation of your knee. This often includes strengthening exercises.  You may resume a normal diet and activities as directed. SEEK MEDICAL CARE IF:   There is increased swelling in your knee.  You notice redness, swelling, or increasing pain in your knee.  An unexplained oral temperature above 102 F (38.9 C) develops.  SEEK IMMEDIATE MEDICAL CARE IF:   You develop a rash.  You have difficulty breathing.  You have any allergic reactions from medications you may have been given.  There is severe pain with any motion of the knee. MAKE SURE YOU:   Understand these instructions.  Will watch your condition.  Will get help right away if you are not doing well or get worse. Document Released: 02/11/2004 Document Revised: 02/13/2012 Document Reviewed: 04/16/2008 [facility] Patient Information 2015 [place], [place]. This information is not intended to replace advice given to you by your health care provider. Make sure you discuss any questions you have with your health care provider.  Emergency Department Resource Guide 1) Find a Doctor and Pay Out  of Pocket Although you won't have to find out who is covered by your insurance plan, it is a good idea to ask around and get recommendations. You will then need to call the office and see if the doctor you have chosen will accept you as a new patient and what types of options they offer for patients who are self-pay. Some doctors offer discounts or will set up payment plans for their patients who do not have insurance, but you will need to ask so you aren't surprised when you get to your appointment.  2) Contact Your Local Health Department Not all health departments have doctors that can see patients for sick visits, but many do, so it is worth a call to see if yours does. If you don't know where your local health department is, you can check in your phone book. The CDC also has a tool to help you locate your state's health department, and many state websites also have listings of all of their local health departments.  3) Find a Walk-in Clinic If your illness is not likely to be very severe or complicated, you may want to try a walk in clinic. These are popping up all over the country in pharmacies, drugstores, and shopping centers. They're usually staffed by nurse practitioners or physician assistants that have been trained to treat common illnesses and complaints. They're usually fairly quick and inexpensive. However, if you have serious medical issues or chronic medical problems, these are probably not your best option.  No Primary Care Doctor: - Call Health Connect at  [phone] - they can help you locate a primary care doctor that  accepts your insurance, provides certain services, etc. - Physician Referral Service- [phone]  Chronic Pain Problems: Organization         Address  Phone   Notes  [facility]  [phone] Patients need to be referred by their primary care doctor.   Medication Assistance: Organization         Address  Phone   Notes  [facility]  [facility] [address] [place] [place], [place] [address] [phone] --Must be a resident of [facility] -- Must have NO insurance coverage whatsoever (no Medicaid/ Medicare, etc.) -- The pt. MUST have a primary care doctor that directs their care regularly and follows them in the community   [facility]  [phone]   [employer]  [phone]    Agencies that provide inexpensive medical care: Organization         Address  Phone   Notes  [facility]  [phone]   [facility]    [phone]   [facility] [address] [place],  [place] [phone]   [facility] [address], [place] [phone]   Planned Parenthood    [phone]) [phone]   [facility]    [phone]   [facility]  [address], [place] Phone:  [phone](336) [phone], Fax:  [phone](336) [phone] Hours of Operation:  9 am - 6 pm, M-F.  Also accepts Medicaid/Medicare and self-pay.  [facility]  [address], [place] Phone: [phone](336) [phone], Fax: [phone]) [phone]. Hours of Operation:  8:30 am - 5:30 pm, M-F.  Also accepts Medicaid and self-pay.  [facility] [place] [address], [place] Phone: [phone]) [phone]   [facility] [address], [place], [place] [phone], Ext. [phone] Mondays & Thursdays: 7-9 AM.  First 15 patients are seen on a first come, first serve basis.    Medicaid-accepting [facility] Providers:  Organization         Address  Phone   Notes  [facility] [address], [place] [phone]) [phone] Also accepts self-pay patients.  [facility] [address], [place]  [phone]) [phone]   [facility] [address], [place] [phone]) [phone]   [facility] [address] [place]  [address], [place] [phone]) [phone]   Renaye RakersVeita Bland [address], [place]   [phone] Only accepts [place] Access [place] patients after they have their name applied to their card.   Self-Pay (no insurance) in [place] [facility]:  Organization         Address  Phone   Notes  Sickle Cell Patients, [facility] [address], [place] [phone]) [phone]   [facility] [address], [place] [phone]) [phone]   [facility]  [address], [place] [phone]   [facility]/Dr. Osei-Bonsu  [address] [place] [address], [place] or [phone], [place] [phone]) [phone] Phone number for both [place] and [place] locations is the same.  [facility] [address], [place] [phone]) [phone]   [facility] [address] [place] [address], [place] or [address] [phone]) [phone]   [facility] [address], [place] [phone](336) [phone], phone; [phone](336) [phone], fax Sees patients 1st and 3rd Saturday of every month.  Must not qualify for public or private insurance (i.e. Medicaid, Medicare, [place] Health Choice, Veterans' Benefits)  Household income should be no more than 200% of the poverty level The clinic cannot treat you if you are pregnant or think you are pregnant  Sexually transmitted diseases are not treated at the clinic.    Dental Care: Organization         Address  Phone  Notes  [facility] Department of [facility] [address], [place] [phone]) [phone] Accepts children up to age 25 who are enrolled in [place] or [place] Health Choice; pregnant women with a Medicaid card; and children who have applied for Medicaid or [place] Health Choice, but were declined, whose parents can pay a reduced fee at time of service.  [facility] of [facility] [place]  [address], [place]  [phone]) [phone] Accepts children up to age 25 who are enrolled in [place] or  Health Choice; pregnant  women with a Medicaid card; and children who have applied for Medicaid or [place] Health Choice, but were declined, whose parents can pay a reduced fee at time of service.  [facility]  [address] [place], [place] [phone] Patients are seen by appointment only. Walk-ins are not accepted. [facility] will see patients 30 years of age and older. Monday - Tuesday (8am-5pm) Most Wednesdays (8:30-5pm) $30 per visit, cash only  [facility] Adult Dental Access PRO[facility]  [address], [facility] [phone] Patients are seen by appointment only. Walk-ins are not accepted. [facility] will see patients 58 years of age and older. One Wednesday Evening (Monthly: Volunteer Based).  $30 per visit, cash only  [employer]  [phone] for adults; Children under age 61, call Graduate Pediatric Dentistry at [phone]. Children aged 64-14, please call [phone] to request a pediatric application.  Dental services are provided in all areas of dental care including fillings, crowns and bridges, complete and partial dentures, implants, gum treatment, root canals, and extractions. Preventive care is also provided. Treatment is provided to both adults and children. Patients are selected via a lottery and there is often a waiting list.   [facility] [address], [place]  [phone] [email or web address]   [facility] [address] [place], [place] [phone], Ext. [phone] Second and Fourth Thursday of each month, opens at 6:30 AM; Clinic ends at 9 AM.  Patients are seen on a first-come first-served basis, and a limited number are seen during each clinic.   [facility]  [address] [place], [place] [phone]   Eligibility Requirements You must have lived in [place], [place], or [place]  counties for at least the last three months.   You cannot be eligible for state or federal sponsored [place], including [employer], [place], or [employer].   You generally cannot be eligible for healthcare insurance through your employer.    How to apply: Eligibility screenings are held every Tuesday and Wednesday afternoon from 1:00 pm until 4:00 pm. You do not need an appointment for the interview!  [facility] [address] [place], [place] [phone]   [facility] Health Department  [phone]   [facility] Health Department  [phone]   [facility] Health Department  [phone]    Behavioral Health Resources in the Community: Intensive Outpatient Programs Organization         Address  Phone  Notes  [facility] [address] [place], [place] [phone]   [facility] [address] [place], [place] [phone]   [facility] [address] [place], [place]  [phone]   [facility] [address]  [place], [place] [phone] or [phone]   Substance Abuse Resources Organization         Address  Phone  Notes  [facility]  [phone]   [facility]  [phone]   [facility]  [phone]   Floydene Flock  [phone]   Residential & Outpatient Substance Abuse Program  [phone]   Psychological Services Organization         Address  Phone  Notes  [facility]  [phone]   [facility]  [phone]   [facility] [address] [place] [phone] or [phone]    Mobile Crisis Teams  Organization         Address  Phone  Notes  [facility], Mobile Crisis Care Unit  [phone]   [facility]  [address] [place], [place] [phone]   [facility] [address] [place]  [place] [phone]    Self-Help/Support Groups Organization         Address  Phone             Notes  [facility] [place] - variety of support groups  [phone]- I7437963 Call for more information  Narcotics Anonymous (NA), [facility] [address], [employer] [place]  2 meetings at this location   Statistician         Address  Phone  Notes  [facility] [address],    [place]  [phone]   [facility]  [address], [place] [address], [place], [place] [phone]   [facility] [address] [place], [place] [phone] Admissions: 8am-3pm M-F  [facility] [address],    [place], [place] [phone]   [facility] [address] [place], [place], [place] [phone]   [facility] [address] [place] [address]  [place], [place] [phone]   [facility] - Intensive Outpatient [address], [place], [place] [phone]   [facility] ([facility] [address] [place]  [place], [place] [phone] or [phone]   [facility] ([facility]) [address], [place], [place] [phone] Accepts Medicaid  [facility] [address]  [place] [phone] Substance Abuse/Addiction Treatment   [facility] Organization         Address  Phone  Notes  [facility]  [phone]   Angie Fava, PhD [address] [place], [place]   [phone] or [phone]   [facility] Behavioral   [address] [place], [place] [phone]   [facility] [address] [place], [place] [phone] Insurance/Medicaid/sponsorship through [facility] and Families [address]                                    [place], [place] [phone] Therapy/tele-psych/case  [facility] [address], [place] [phone]    Dr. Lolly Mustache  [phone]   [facility]  [employer] [facility] Dept. 1) [address] [place] 2) [address] [place] 3)  [address], [place] [phone]  [phone]   [facility] Child Abuse Hotline 442-603-4444 or [phone] (After Hours)

## 2015-03-19 NOTE — ED Notes (Signed)
Patient presents with c/o chest discomfort for the last 3-5 days, left knee pain

## 2015-03-19 NOTE — ED Provider Notes (Signed)
CSN: [id number]     Arrival date & time 03/19/15  0047 History   This chart was scribed for Arby BarretteMarcy Deny Chevez, MD by Evon Slackerrance Branch, ED Scribe. This patient was seen in room A05C/A05C and the patient's care was started at 2:06 AM.      Chief Complaint  Patient presents with  . Joint Pain  . Chest Pain   Patient is a 24 y.o. male presenting with chest pain. The history is provided by the patient. No language interpreter was used.  Chest Pain  HPI Comments: Adam Benton is a 24 y.o. male who presents to the Emergency Department complaining of CP onset 5 days prior. Pt reports right knee pain as well with associated swelling. Pt state that he has some arthralgias in his right hand that's worse when making a fist. Pt reports feeling fatigued and intermittent shortness of breath as well over the past year. Pt traces his symptoms to a right knee injury 1 year ago. Pt also reports bilateral hip pain and right ankle pain for the past 6 months. Pt denies new injury or trauma. Pt states that his ankle pain is worse when bearing weight. Pt denies fever, abdominal pain or other related symptoms. Pt denies any new medications other than naproxen that he no longer takes.    History reviewed. No pertinent past medical history. History reviewed. No pertinent past surgical history. No family history on file. History  Substance Use Topics  . Smoking status: Current Some Day Smoker  . Smokeless tobacco: Never Used  . Alcohol Use: Yes     Comment: Socially    Review of Systems  Cardiovascular: Positive for chest pain.   A complete 10 system review of systems was obtained and all systems are negative except as noted in the HPI and PMH.     Allergies  Peanut oil  Home Medications   Prior to Admission medications   Medication Sig Start Date End Date Taking? Authorizing Provider  hydrOXYzine (ATARAX/VISTARIL) 25 MG tablet Take 1 tablet (25 mg total) by mouth every 6 (six) hours as needed for  anxiety. Patient not taking: Reported on 03/19/2015 10/02/14   Adonis BrookSheila Agustin, NP  ibuprofen (ADVIL,MOTRIN) 600 MG tablet Take 1 tablet (600 mg total) by mouth every 6 (six) hours as needed. 03/19/15   Arby BarretteMarcy Lorita Forinash, MD  traZODone (DESYREL) 50 MG tablet Take 1 tablet (50 mg total) by mouth at bedtime and may repeat dose one time if needed. For insomnia and depression Patient not taking: Reported on 03/19/2015 10/02/14   Adonis BrookSheila Agustin, NP   BP 115/96 mmHg  Pulse 65  Temp(Src) 98.1 F (36.7 C) (Oral)  Resp 11  Ht 5\' 9"  (1.753 m)  Wt 162 lb (73.483 kg)  BMI 23.91 kg/m2  SpO2 100%   Physical Exam  Constitutional: He is oriented to person, place, and time. He appears well-developed and well-nourished. No distress.  HENT:  Head: Normocephalic and atraumatic.  Nose: Nose normal.  Mouth/Throat: Oropharynx is clear and moist.  Eyes: Conjunctivae and EOM are normal. No scleral icterus.  Neck: Neck supple. No tracheal deviation present.  Cardiovascular: Normal rate, regular rhythm, normal heart sounds and intact distal pulses.   Pulmonary/Chest: Effort normal and breath sounds normal. No respiratory distress. He has no wheezes. He has no rales. He exhibits tenderness.  Patient's chest was exquisitely tender to palpation. Particularly at the left costal sternal margins.  Abdominal: Soft. Bowel sounds are normal. He exhibits no distension and no mass. There is  no tenderness. There is no rebound and no guarding.  Musculoskeletal: Normal range of motion.  Patient has a moderate effusion at the left knee. There is not associated erythema. The patient is able to perform spontaneous range of motion with flexion and extension. He does report however deep flexion is painful. Both ankles and feet are normal appearance. Patient does endorse pain to palpation over the right first and second metacarpals. There does appear to be mild to moderate swelling in this area as well as some mild warmth. Other joints are  normal in appearance. There are no significant joint deviations or deformities.  Neurological: He is alert and oriented to person, place, and time. No cranial nerve deficit. He exhibits normal muscle tone. Coordination normal.  Skin: Skin is warm and dry. No rash noted.  Psychiatric: He has a normal mood and affect. His behavior is normal.  Nursing note and vitals reviewed.   ED Course  ARTHOCENTESIS Date/Time: 03/24/2015 1:28 AM Performed by: Arby Barrette Authorized by: Arby Barrette Consent: Verbal consent obtained. Risks and benefits: risks, benefits and alternatives were discussed Consent given by: patient Indications: joint swelling and diagnostic evaluation  Body area: knee Joint: left knee Local anesthesia used: yes Local anesthetic: lidocaine 1% without epinephrine Patient sedated: no Preparation: Patient was prepped and draped in the usual sterile fashion. Needle gauge: 18 G Ultrasound guidance: no Approach: lateral Aspirate: blood-tinged and yellow Aspirate amount: 50 mL   (including critical care time)  DIAGNOSTIC STUDIES: Oxygen Saturation is 100% on RA, normal by my interpretation.    COORDINATION OF CARE: 2:20 AM-Discussed treatment plan with pt at bedside and pt agreed to plan.     Labs Review Labs Reviewed  CBC WITH DIFFERENTIAL/PLATELET - Abnormal; Notable for the following:    Hemoglobin 12.7 (*)    HCT 36.9 (*)    MCV 63.7 (*)    MCH 21.9 (*)    RDW 15.9 (*)    All other components within normal limits  URINALYSIS, ROUTINE W REFLEX MICROSCOPIC - Abnormal; Notable for the following:    Color, Urine AMBER (*)    Specific Gravity, Urine 1.035 (*)    Ketones, ur 15 (*)    All other components within normal limits  SYNOVIAL CELL COUNT + DIFF, W/ CRYSTALS - Abnormal; Notable for the following:    Appearance-Synovial CLOUDY (*)    WBC, Synovial 5016 (*)    All other components within normal limits  CULTURE, BLOOD (ROUTINE X 2)  CULTURE, BLOOD  (ROUTINE X 2)  BODY FLUID CULTURE  GONOCOCCUS CULTURE  BASIC METABOLIC PANEL  HIV ANTIBODY (ROUTINE TESTING)  SEDIMENTATION RATE  HEPATIC FUNCTION PANEL  I-STAT TROPOININ, ED  GC/CHLAMYDIA PROBE AMP ([place])    Imaging Review No results found.   EKG Interpretation   Date/Time:  Thursday March 19 2015 00:52:48 EDT Ventricular Rate:  75 PR Interval:  138 QRS Duration: 84 QT Interval:  364 QTC Calculation: 406 R Axis:   94 Text Interpretation:  Normal sinus rhythm Rightward axis Septal infarct ,  age undetermined Abnormal ECG no STEMI. no old Confirmed by Donnald Garre, MD,  Lebron Conners [phone]) on 03/19/2015 6:18:19 AM      MDM   Final diagnoses:  Joint effusion of knee  Polyarthralgia        Arby Barrette, MD 03/24/15 [phone]

## 2015-03-19 NOTE — ED Provider Notes (Signed)
  Physical Exam  BP 102/74 mmHg  Pulse 78  Temp(Src) 98.1 F (36.7 C) (Oral)  Resp 23  Ht $R'5\' 9"'hT$  (1.753 m)  Wt 162 lb (73.483 kg)  BMI 23.91 kg/m2  SpO2 100%  Physical Exam  ED Course  Procedures  MDM Care assumed at sign out. Patient here with joint pain and has L knee effusion. Afebrile, well appearing. Sign out to follow up arthrocentesis result. There are no crystals, WBC count was 5000 with mostly monocytes. ESR nl. I think he can go home. Prescriptions written by previous provider.   Wandra Arthurs, MD 03/19/15 [phone]

## 2015-03-19 NOTE — ED Notes (Signed)
Lab called to inquire about remaining synovial cell count results.

## 2015-03-22 LAB — BODY FLUID CULTURE
CULTURE: NO GROWTH
SPECIAL REQUESTS: NORMAL

## 2015-03-22 LAB — GONOCOCCUS CULTURE
CULTURE: NO GROWTH
SPECIAL REQUESTS: NORMAL

## 2015-03-25 LAB — CULTURE, BLOOD (ROUTINE X 2)
Culture: NO GROWTH
Culture: NO GROWTH

## 2015-04-13 ENCOUNTER — Emergency Department
Admission: EM | Admit: 2015-04-13 | Discharge: 2015-04-14 | Disposition: A | Discharge status: Home / Self Care | Payer: 59 | Attending: Emergency Medicine | Admitting: Emergency Medicine

## 2015-04-13 ENCOUNTER — Encounter: Payer: Self-pay | Admitting: *Deleted

## 2015-04-13 DIAGNOSIS — F4323 Adjustment disorder with mixed anxiety and depressed mood: Secondary | ICD-10-CM | POA: Insufficient documentation

## 2015-04-13 DIAGNOSIS — Z9119 Patient's noncompliance with other medical treatment and regimen: Secondary | ICD-10-CM | POA: Insufficient documentation

## 2015-04-13 DIAGNOSIS — F3181 Bipolar II disorder: Secondary | ICD-10-CM

## 2015-04-13 DIAGNOSIS — Z720 Tobacco use: Secondary | ICD-10-CM | POA: Insufficient documentation

## 2015-04-13 LAB — CBC
HCT: 43.2 % (ref 40.0–52.0)
HEMOGLOBIN: 13.8 g/dL (ref 13.0–18.0)
MCH: 21.3 pg — AB (ref 26.0–34.0)
MCHC: 32 g/dL (ref 32.0–36.0)
MCV: 66.8 fL — AB (ref 80.0–100.0)
PLATELETS: 273 10*3/uL (ref 150–440)
RBC: 6.48 MIL/uL — AB (ref 4.40–5.90)
RDW: 17.1 % — AB (ref 11.5–14.5)
WBC: 6.5 10*3/uL (ref 3.8–10.6)

## 2015-04-13 LAB — COMPREHENSIVE METABOLIC PANEL
ALT: 14 U/L — AB (ref 17–63)
AST: 25 U/L (ref 15–41)
Albumin: 4.9 g/dL (ref 3.5–5.0)
Alkaline Phosphatase: 63 U/L (ref 38–126)
Anion gap: 9 (ref 5–15)
BILIRUBIN TOTAL: 0.7 mg/dL (ref 0.3–1.2)
BUN: 13 mg/dL (ref 6–20)
CO2: 24 mmol/L (ref 22–32)
Calcium: 9.4 mg/dL (ref 8.9–10.3)
Chloride: 107 mmol/L (ref 101–111)
Creatinine, Ser: 1.01 mg/dL (ref 0.61–1.24)
GFR calc Af Amer: >60 mL/min (ref >60)
GLUCOSE: 81 mg/dL (ref 65–99)
Potassium: 3.2 mmol/L — ABNORMAL LOW (ref 3.5–5.1)
SODIUM: 140 mmol/L (ref 135–145)
Total Protein: 9 g/dL — ABNORMAL HIGH (ref 6.5–8.1)

## 2015-04-13 LAB — URINE DRUG SCREEN, QUALITATIVE (ARMC ONLY)
Amphetamines, Ur Screen: NOT DETECTED
BARBITURATES, UR SCREEN: NOT DETECTED
Benzodiazepine, Ur Scrn: NOT DETECTED
Cannabinoid 50 Ng, Ur ~~LOC~~: POSITIVE — AB
Cocaine Metabolite,Ur ~~LOC~~: NOT DETECTED
MDMA (ECSTASY) UR SCREEN: NOT DETECTED
METHADONE SCREEN, URINE: NOT DETECTED
Opiate, Ur Screen: NOT DETECTED
Phencyclidine (PCP) Ur S: NOT DETECTED
Tricyclic, Ur Screen: NOT DETECTED

## 2015-04-13 LAB — SALICYLATE LEVEL: Salicylate Lvl: <4 mg/dL (ref 2.8–30.0)

## 2015-04-13 LAB — ACETAMINOPHEN LEVEL: Acetaminophen (Tylenol), Serum: <10 ug/mL — ABNORMAL LOW (ref 10–30)

## 2015-04-13 LAB — ETHANOL: ALCOHOL ETHYL (B): 30 mg/dL — AB (ref <5)

## 2015-04-13 NOTE — ED Notes (Signed)
Pt states since oct 2015 he has been feeling bad. Pt states today took a drive and has been thinking of hurting himself. Pt does not have a plan. Pt states he needs help to stop his thoughts

## 2015-04-13 NOTE — BH Assessment (Signed)
Assessment Note  Adam RiversShawn Benton is an 24 y.o. male, who presents to the ED stating, "[place]; I need to talk to someone; I analyzed my life; and my family is not there/here for me; I have not slept in 48 hrs; today, my baby's mother told me that, she cheated on me; and that, my daughter is not my daughter; I had so much anxiety; I tried to talk to my family; and nobody wants to listen; they everything is my fault; nobody listens to me; I'm not eating; I have been drinking more and smoking weed; that's the only way I can eat or sleep; I just needed to talk to somebody; back in 10 '2015; I took some pills and went to the ER; I stayed overnight."   Axis I: Major Depression, single episode Axis II: Deferred Axis III: No past medical history on file. Axis IV: other psychosocial or environmental problems, problems related to social environment and problems with access to health care services Axis V: 31-40 impairment in reality testing  Past Medical History: No past medical history on file.  No past surgical history on file.  Family History: No family history on file.  Social History:  reports that he has been smoking.  He has never used smokeless tobacco. He reports that he drinks alcohol. He reports that he uses illicit drugs (Marijuana).  Additional Social History:     CIWA: CIWA-Ar BP: 125/85 mmHg Pulse Rate: 63 COWS:    Allergies:  Allergies  Allergen Reactions  . Peanut Oil Swelling    Home Medications:  (Not in a hospital admission)  OB/GYN Status:  No LMP for male patient.  General Assessment Data [facility] Assessment: In system Is this a Tele or Face-to-Face Assessment?: Face-to-Face Is this an Initial Assessment or a Re-assessment for this encounter?: Initial Assessment Marital status: Single Is patient pregnant?: No Pregnancy Status: No Living Arrangements: Non-relatives/Friends Can pt return to current living  arrangement?: Yes Admission Status: Voluntary Is patient capable of signing voluntary admission?: Yes Referral Source: Self/Family/Friend Insurance type: [employer]  Medical Screening Exam [facility] Walk-[facility] ONLY) Medical Exam completed: Yes  Crisis Care Plan Living Arrangements: Non-relatives/Friends Name of Psychiatrist: none Name of Therapist: none  Education Status Is patient currently in school?: No Current Grade: n/a Highest grade of school patient has completed: 12th Name of school: n/a Contact person: friend  Risk to self with the past 6 months Suicidal Ideation: Yes-Currently Present Has patient been a risk to self within the past 6 months prior to admission? : No Suicidal Intent: No Has patient had any suicidal intent within the past 6 months prior to admission? : No Is patient at risk for suicide?: No (no) Suicidal Plan?: No Has patient had any suicidal plan within the past 6 months prior to admission? : No Access to Means: No What has been your use of drugs/alcohol within the last 12 months?: alcohol Previous Attempts/Gestures: No How many times?: 0 Other Self Harm Risks: depression; [place] of supports Triggers for Past Attempts: Family contact, Other personal contacts Intentional Self Injurious Behavior: None Family Suicide History: No Recent stressful life event(s): Conflict (Comment) (baby's mother) Persecutory voices/beliefs?: No Depression: Yes Depression Symptoms: Despondent, Tearfulness, Isolating Substance abuse history and/or treatment for substance abuse?: Yes Suicide prevention information given to non-admitted patients: Yes  Risk to Others within the past 6 months Homicidal Ideation: No Does patient have any lifetime risk of violence toward others beyond the  six months prior to admission? : No Thoughts of Harm to Others: No Current Homicidal Intent: No Current Homicidal Plan: No Access to Homicidal Means: No Identified Victim: none History of harm  to others?: No Assessment of Violence: On admission Violent Behavior Description: none Does patient have access to weapons?: No Criminal Charges Pending?: No Does patient have a court date: No Is patient on probation?: No  Psychosis Hallucinations: None noted Delusions: None noted  Mental Status Report Appearance/Hygiene: In scrubs Eye Contact: Good Motor Activity: Unremarkable Speech: Soft, Slow Level of Consciousness: Alert Mood: Helpless, Sad Affect: Depressed, Sad Anxiety Level: Minimal Thought Processes: Relevant, Coherent, Circumstantial Judgement: Partial Orientation: Person, Place, Time, Situation Obsessive Compulsive Thoughts/Behaviors: None  Cognitive Functioning Concentration: Good Memory: Recent Intact, Remote Intact IQ: Average Insight: Fair Impulse Control: Good Appetite: Fair Weight Loss: 0 Weight Gain: 0 Sleep: Decreased Total Hours of Sleep: 2 ("I have not slept in 48 hrs.) Vegetative Symptoms: None  ADLScreening [facility] Assessment Services) Patient's cognitive ability adequate to safely complete daily activities?: Yes Patient able to express need for assistance with ADLs?: Yes Independently performs ADLs?: Yes (appropriate for developmental age)  Prior Inpatient Therapy Prior Inpatient Therapy: No Prior Therapy Dates: noe Prior Therapy Facilty/Provider(s): none Reason for Treatment: none  Prior Outpatient Therapy Prior Outpatient Therapy: No Prior Therapy Dates: none Prior Therapy Facilty/Provider(s): none Reason for Treatment: none Does patient have an ACCT team?: No Does patient have Intensive In-House Services?  : No Does patient have [facility] services? : No Does patient have P4CC services?: No  ADL Screening (condition at time of admission) Patient's cognitive ability adequate to safely complete daily activities?: Yes Patient able to express need for assistance with ADLs?: Yes Independently performs ADLs?: Yes (appropriate for  developmental age)       Abuse/Neglect Assessment (Assessment to be complete while patient is alone) Physical Abuse: Denies Verbal Abuse: Denies Sexual Abuse: Denies Exploitation of patient/patient's resources: Denies Self-Neglect: Denies Values / Beliefs Cultural Requests During Hospitalization: None Spiritual Requests During Hospitalization: None Consults Spiritual Care Consult Needed: No Social Work Consult Needed: No Merchant navy officerAdvance Directives (For Healthcare) Does patient have an advance directive?: No Would patient like information on creating an advanced directive?: Yes English as a second language teacher- Educational materials given    Additional Information 1:1 In Past 12 Months?: No CIRT Risk: No Elopement Risk: No Does patient have medical clearance?: Yes  Child/Adolescent Assessment Running Away Risk: Denies Bed-Wetting: Denies Destruction of Property: Denies Cruelty to Animals: Denies Stealing: Denies Rebellious/Defies Authority: Denies Satanic Involvement: Denies Archivistire Setting: Denies Problems at Progress EnergySchool: Denies Gang Involvement: Denies  Disposition:  Disposition Initial Assessment Completed for this Encounter: Yes Disposition of Patient: Referred to (psych MD to see) Patient referred to: Other (Comment) (psych MD to see)  On Site Evaluation by:   Reviewed with Physician:    Dwan BoltMargaret Rosabell Geyer 04/13/2015 11:28 PM

## 2015-04-14 DIAGNOSIS — F3181 Bipolar II disorder: Secondary | ICD-10-CM

## 2015-04-14 NOTE — ED Notes (Signed)
BEHAVIORAL HEALTH ROUNDING Patient sleeping: No. Patient alert and oriented: yes Behavior appropriate: Yes.  ;  Nutrition and fluids offered: Yes  Toileting and hygiene offered: Yes  Sitter present: yes Law enforcement present: Yes  

## 2015-04-14 NOTE — ED Notes (Signed)
Pt. Noted sleeping in room. No complaints or concerns voiced. No distress or abnormal behavior noted. Will continue to monitor with security cameras. Q 15 minute rounds continue. 

## 2015-04-14 NOTE — ED Notes (Signed)
BEHAVIORAL HEALTH ROUNDING Patient sleeping: No. Patient alert and oriented: yes Behavior appropriate: Yes.  ; If no, describe:  Nutrition and fluids offered: Yes  Toileting and hygiene offered: Yes  Sitter present: q15 minute observations Law enforcement present: yes  ODS 

## 2015-04-14 NOTE — ED Notes (Signed)
Pt observed sitting in a recliner in the dayroom   NAD  Psych md is here and will be consulting soon

## 2015-04-14 NOTE — ED Notes (Signed)
Pt is sitting in a recliner in the dayroom   NAD observed  Pt to be discharged to home

## 2015-04-14 NOTE — ED Notes (Signed)
Lunch provided  Pt sitting in bed watching Tv  No verbalized concerns or complaints at this time  Psych consult pending

## 2015-04-14 NOTE — ED Notes (Signed)
Pt lying in bed with his eyes closed  Even, unlabored respirations observed  NAD  Continue to monitor

## 2015-04-14 NOTE — ED Notes (Signed)
No am meds ordered at this time   Assessment completed  No verbalized concerns or complaints at this time   Calm appropriate behavior observed  Denies pain

## 2015-04-14 NOTE — ED Notes (Signed)
Pt verbalizing concern over when the psychiatrist will come to see him  Pt is voluntary and he is expressing a desire to leave and make an appointment with the doctor  Pt informed that a consult is pending and the doctor will be coming in to talk with him  Encouraged him to be patient  Pt agrees to try

## 2015-04-14 NOTE — Discharge Instructions (Signed)
Adjustment Disorder  Follow-up as directed by the psychiatry service today. I recommend he [place] up with [facility] [place] tomorrow.  Return to the ER right away should you develop thoughts of wanting to harm yourself or others.  Most changes in life can cause stress. Getting used to changes may take a few months or longer. If feelings of stress, hopelessness, or worry continue, you may have an adjustment disorder. This stress-related mental health problem may affect your feelings, thinking and how you act. It occurs in both sexes and happens at any age. SYMPTOMS  Some of the following problems may be seen and vary from person to person:  Sadness or depression.  Loss of enjoyment.  Thoughts of suicide.  Fighting.  Avoiding family and friends.  Poor school performance.  Hopelessness, sense of loss.  Trouble sleeping.  Vandalism.  Worry, weight loss or gain.  Crying spells.  Anxiety  Reckless driving.  Skipping school.  Poor work International aid/development workerperformance.  Nervousness.  Ignoring bills.  Poor attitude. DIAGNOSIS  Your caregiver will ask what has happened in your life and do a physical exam. They will make a diagnosis of an adjustment disorder when they are sure another problem or medical illness causing your feelings does not exist. TREATMENT  When problems caused by stress interfere with you daily life or last longer than a few months, you may need counseling for an adjustment disorder. Early treatment may diminish problems and help you to better cope with the stressful events in your life. Sometimes medication is necessary. Individual counseling and or support groups can be very helpful. PROGNOSIS  Adjustment disorders usually last less than 3 to 6 months. The condition may persist if there is long lasting stress. This could include health problems, relationship problems, or job difficulties where you can not easily escape from what is causing the problem. PREVENTION  Even the  most mentally healthy, highly functioning people can suffer from an adjustment disorder given a significant blow from a life-changing event. There is no way to prevent pain and loss. Most people need help from time to time. You are not alone. SEEK MEDICAL CARE IF:  Your feelings or symptoms listed above do not improve or worsen. Document Released: 07/26/2006 Document Revised: 02/13/2012 Document Reviewed: 10/17/2007 [facility] Patient Information 2015 [place]. This information is not intended to replace advice given to you by your health care provider. Make sure you discuss any questions you have with your health care provider.

## 2015-04-14 NOTE — ED Notes (Signed)

## 2015-04-14 NOTE — ED Notes (Signed)
Pt lying in bed  NAD observed  

## 2015-04-14 NOTE — ED Notes (Signed)
ED BHU PLACEMENT JUSTIFICATION Is the patient under IVC or is there intent for IVC: No. Is the patient medically cleared: Yes.   Is there vacancy in the ED BHU: Yes.   Is the population mix appropriate for patient: Yes.   Is the patient awaiting placement in inpatient or outpatient setting: Yes.   Has the patient had a psychiatric consult: No.Survey of unit performed for contraband, proper placement and condition of furniture, tampering with fixtures in bathroom, shower, and each patient room: Yes.  ; Findings:  APPEARANCE/BEHAVIOR Calm cooperative   Appropriate to stimulation NEURO ASSESSMENT Orientation: oriented x3  Denies pain Hallucinations: No.None noted (Hallucinations) Speech: Normal Gait: normal RESPIRATORY ASSESSMENT Even unlabored respirations noted  CARDIOVASCULAR ASSESSMENT regular rate and rhythm, S1, S2 normal, no murmur, click, rub or gallop and regular rate  pulses equal  skin warm and dry  GASTROINTESTINAL ASSESSMENT no GI complaint EXTREMITIES Full ROM PLAN OF CARE Provide calm/safe environment. Vital signs assessed twice daily. ED BHU Assessment once each 12-hour shift. Collaborate with intake RN daily or as condition indicates. Assure the ED provider has rounded once each shift. Provide and encourage hygiene. Provide redirection as needed. Assess for escalating behavior; address immediately and inform ED provider.  Assess family dynamic and appropriateness for visitation as needed: Yes.  ; If necessary, describe findings:  Educate the patient/family about BHU procedures/visitation: Yes.  ; If necessary, describe findings:

## 2015-04-14 NOTE — ED Provider Notes (Signed)
-----------------------------------------   3:28 PM on 04/14/2015 -----------------------------------------  Appears to be adjustment disorder.  Seen and cleared for discharge by Dr. Toni Amendlapacs.  Patient will follow-up as an outpatient with psychiatric services, I have given him referral information to [facility].  Sharyn CreamerMark Quale, MD 04/14/15 [phone]

## 2015-04-14 NOTE — ED Provider Notes (Signed)
-----------------------------------------   7:26 AM on 04/14/2015 -----------------------------------------   BP 125/85 mmHg  Pulse 63  Temp(Src) 98.2 F (36.8 C) (Oral)  Resp 17  Ht 5\' 9"  (1.753 m)  Wt 162 lb (73.483 kg)  BMI 23.91 kg/m2  SpO2 100%  The patient had no acute events since last update.  Calm and cooperative at this time.  Disposition is pending per Psychiatry/Behavioral Medicine team recommendations.     Myrna Blazeravid Matthew Dontrel Smethers, MD 04/14/15 [phone]
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
## 2015-04-14 NOTE — ED Notes (Signed)
Pt. To BHU from ED ambulatory without difficulty, to room  . Report from [employer] RN. Pt. Is alert and oriented, warm and dry in no distress. Pt. Denies SI, HI, and AVH. Pt. Calm and cooperative. Pt. Made aware of security cameras and Q15 minute rounds. Pt. Encouraged to let Nursing staff know of any concerns or needs.

## 2015-04-14 NOTE — ED Notes (Addendum)
1/1 bags of belongings returned to him and he verbalized that he received back all belongings that he came here with  Discharge instructions reviewed with the pt and he verbalized agreement and understanding    Pt discharged with a script

## 2015-04-14 NOTE — ED Notes (Signed)
Breakfast provided  Pt rolled over and stated  "Thank you"  Pt informed that it could be after lunch before psych consult   Pt verbalized understanding  NAD assessed

## 2015-04-14 NOTE — ED Notes (Signed)
BEHAVIORAL HEALTH ROUNDING Patient sleeping: No. Patient alert and oriented: yes Behavior appropriate: Yes.  ; If no, describe:  Nutrition and fluids offered: Yes  Toileting and hygiene offered: Yes  Sitter present: q15 minute observations Law enforcement present: yes  ODS  [employer]

## 2015-04-14 NOTE — Consult Note (Signed)
[facility] Face-to-Face Psychiatry Consult   Reason for Consult:  Patient with a history of mood instability. Comes to the hospital because "the situation happened" Referring Physician:  schaevitz Patient Identification: Adam Benton MRN:  [id number] Principal Diagnosis: Bipolar 2 disorder Diagnosis:   Patient Active Problem List   Diagnosis Date Noted  . Bipolar 2 disorder [F31.81] 04/14/2015  . Major depression, single episode [F32.9] 10/02/2014  . Adjustment disorder with mixed anxiety and depressed mood [F43.23] 10/02/2014  . Suicide attempt [T14.91] 10/01/2014    Total Time spent with patient: 1 hour  Subjective:   Adam Benton is a 24 y.o. male patient admitted with patient reports "a situation happened". Says he got overwhelmed by a trivial situation and couldn't control his feelings. Feeling erratic for the last couple days.  HPI:  Patient presents stating that yesterday he got into a minor disagreement with his girlfriend. This caused him to get overwhelmed by emotions and feelings. Felt like his mind was racing. He says he never had any suicidal or homicidal ideation. He got in his car and drove from [place] over here to [place] looking for someone to talk to. He says that he is not currently taking any psychiatric medicine or getting any psychiatric treatment. He has felt under some stress recently because he is graduating from college doesn't have a job lined up yet. He uses marijuana on a nearly daily basis but says he is not using any other drugs and not drinking regularly. His sleep problems are erratic. Sometimes he will go for 2 or 3 days without sleep and at other times sleep for 2 or 3 days at a time. Appetite is up and down.  Patient has a history of one prior suicide attempt years ago. Has been prescribed medication in the past but doesn't remember what they were. Never followed up with outpatient treatment. Was in: Behavioral furl what looks like a very short stay in  the past under similar circumstances and was diagnosed with adjustment disorder.  Social history is that he is currently living by himself in [place]. 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 Hopes to get a job after that. Has a girlfriend with whom he has a child in [place] [place] and usually they have a pretty good relationship.  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 He still has some chronic pain and it blames that for a lot of his mood problems.  Current medications none  Family history: No known family history of mental health or substance abuse problems  Laboratory results positive for marijuana.   HPI Elements:   Quality:  Manic like symptoms with racing thoughts and feeling overwhelmed by his mood. Severity:  Moderate severity. Timing:  Worse over the last couple weeks especially when he is not sleeping. Duration:  Only lasted for a day or so to time. Context:  Graduating from college. Time of transition in his life..  Past Medical History: History reviewed. No pertinent past medical history. History reviewed. No pertinent past surgical history. Family History: No family history on file. Social History:  History  Alcohol Use  . Yes    Comment: Socially     History  Drug Use  . Yes  . Special: Marijuana    History   Social History  . Marital Status: Single    Spouse Name: N/A  . Number of Children: N/A  . Years of Education: N/A  Social History Main Topics  . Smoking status: Current Some Day Smoker  . Smokeless tobacco: Never Used  . Alcohol Use: Yes     Comment: Socially  . Drug Use: Yes    Special: Marijuana  . Sexual Activity: Not on file   Other Topics Concern  . None   Social History Narrative   Additional Social History:                          Allergies:   Allergies  Allergen Reactions  . Peanut Oil Swelling    Labs:  Results for orders placed or performed  during the hospital encounter of 04/13/15 (from the past 48 hour(s))  Acetaminophen level     Status: Abnormal   Collection Time: 04/13/15  7:41 PM  Result Value Ref Range   Acetaminophen (Tylenol), Serum <10 (L) 10 - 30 ug/mL    Comment:        THERAPEUTIC CONCENTRATIONS VARY SIGNIFICANTLY. A RANGE OF 10-30 ug/mL MAY BE AN EFFECTIVE CONCENTRATION FOR MANY PATIENTS. HOWEVER, SOME ARE BEST TREATED AT CONCENTRATIONS OUTSIDE THIS RANGE. ACETAMINOPHEN CONCENTRATIONS >150 ug/mL AT 4 HOURS AFTER INGESTION AND >50 ug/mL AT 12 HOURS AFTER INGESTION ARE OFTEN ASSOCIATED WITH TOXIC REACTIONS.   CBC     Status: Abnormal   Collection Time: 04/13/15  7:41 PM  Result Value Ref Range   WBC 6.5 3.8 - 10.6 K/uL   RBC 6.48 (H) 4.40 - 5.90 MIL/uL   Hemoglobin 13.8 13.0 - 18.0 g/dL   HCT 43.2 40.0 - 52.0 %   MCV 66.8 (L) 80.0 - 100.0 fL   MCH 21.3 (L) 26.0 - 34.0 pg   MCHC 32.0 32.0 - 36.0 g/dL   RDW 17.1 (H) 11.5 - 14.5 %   Platelets 273 150 - 440 K/uL  Comprehensive metabolic panel     Status: Abnormal   Collection Time: 04/13/15  7:41 PM  Result Value Ref Range   Sodium 140 135 - 145 mmol/L   Potassium 3.2 (L) 3.5 - 5.1 mmol/L   Chloride 107 101 - 111 mmol/L   CO2 24 22 - 32 mmol/L   Glucose, Bld 81 65 - 99 mg/dL   BUN 13 6 - 20 mg/dL   Creatinine, Ser 1.01 0.61 - 1.24 mg/dL   Calcium 9.4 8.9 - 10.3 mg/dL   Total Protein 9.0 (H) 6.5 - 8.1 g/dL   Albumin 4.9 3.5 - 5.0 g/dL   AST 25 15 - 41 U/L   ALT 14 (L) 17 - 63 U/L   Alkaline Phosphatase 63 38 - 126 U/L   Total Bilirubin 0.7 0.3 - 1.2 mg/dL   GFR calc non Af Amer >60 >60 mL/min   GFR calc Af Amer >60 >60 mL/min    Comment: (NOTE) The eGFR has been calculated using the CKD EPI equation. This calculation has not been validated in all clinical situations. eGFR's persistently <60 mL/min signify possible Chronic Kidney Disease.    Anion gap 9 5 - 15  Ethanol (ETOH)     Status: Abnormal   Collection Time: 04/13/15  7:41 PM   Result Value Ref Range   Alcohol, Ethyl (B) 30 (H) <5 mg/dL    Comment:        LOWEST DETECTABLE LIMIT FOR SERUM ALCOHOL IS 11 mg/dL FOR MEDICAL PURPOSES ONLY   Salicylate level     Status: None   Collection Time: 04/13/15  7:41 PM  Result Value Ref Range  Salicylate Lvl <1.6 2.8 - 30.0 mg/dL  Urine Drug Screen, Qualitative [facility])     Status: Abnormal   Collection Time: 04/13/15  8:32 PM  Result Value Ref Range   Tricyclic, Ur Screen NONE DETECTED NONE DETECTED   Amphetamines, Ur Screen NONE DETECTED NONE DETECTED   MDMA (Ecstasy)Ur Screen NONE DETECTED NONE DETECTED   Cocaine Metabolite,Ur [place] NONE DETECTED NONE DETECTED   Opiate, Ur Screen NONE DETECTED NONE DETECTED   Phencyclidine (PCP) Ur S NONE DETECTED NONE DETECTED   Cannabinoid 50 Ng, Ur [place] POSITIVE (A) NONE DETECTED   Barbiturates, Ur Screen NONE DETECTED NONE DETECTED   Benzodiazepine, Ur Scrn NONE DETECTED NONE DETECTED   Methadone Scn, Ur NONE DETECTED NONE DETECTED    Comment: (NOTE) 109  Tricyclics, urine               Cutoff 1000 ng/mL 200  Amphetamines, urine             Cutoff 1000 ng/mL 300  MDMA (Ecstasy), urine           Cutoff 500 ng/mL 400  Cocaine Metabolite, urine       Cutoff 300 ng/mL 500  Opiate, urine                   Cutoff 300 ng/mL 600  Phencyclidine (PCP), urine      Cutoff 25 ng/mL 700  Cannabinoid, urine              Cutoff 50 ng/mL 800  Barbiturates, urine             Cutoff 200 ng/mL 900  Benzodiazepine, urine           Cutoff 200 ng/mL 1000 Methadone, urine                Cutoff 300 ng/mL 1100 1200 The urine drug screen provides only a preliminary, unconfirmed 1300 analytical test result and should not be used for non-medical 1400 purposes. Clinical consideration and professional judgment should 1500 be applied to any positive drug screen result due to possible 1600 interfering substances. A more specific alternate chemical method 1700 must be used in order to obtain a confirmed  analytical result.  1800 Gas chromato graphy / mass spectrometry (GC/MS) is the preferred 1900 confirmatory method.     Vitals: Blood pressure 122/83, pulse 65, temperature 98.1 F (36.7 C), temperature source Oral, resp. rate 18, height $RemoveBe'5\' 9"'RhFaTHPpv$  (1.753 m), weight 73.483 kg (162 lb), SpO2 100 %.  Risk to Self: Suicidal Ideation: Yes-Currently Present Suicidal Intent: No Is patient at risk for suicide?: No (no) Suicidal Plan?: No Access to Means: No What has been your use of drugs/alcohol within the last 12 months?: alcohol How many times?: 0 Other Self Harm Risks: depression; [place] of supports Triggers for Past Attempts: Family contact, Other personal contacts Intentional Self Injurious Behavior: None Risk to Others: Homicidal Ideation: No Thoughts of Harm to Others: No Current Homicidal Intent: No Current Homicidal Plan: No Access to Homicidal Means: No Identified Victim: none History of harm to others?: No Assessment of Violence: On admission Violent Behavior Description: none Does patient have access to weapons?: No Criminal Charges Pending?: No Does patient have a court date: No Prior Inpatient Therapy: Prior Inpatient Therapy: No Prior Therapy Dates: noe Prior Therapy Facilty/Provider(s): none Reason for Treatment: none Prior Outpatient Therapy: Prior Outpatient Therapy: No Prior Therapy Dates: none Prior Therapy Facilty/Provider(s): none Reason for Treatment: none Does patient have an ACCT team?:  No Does patient have Intensive In-House Services?  : No Does patient have [facility] services? : No Does patient have P4CC services?: No  No current facility-administered medications for this encounter.   Current Outpatient Prescriptions  Medication Sig Dispense Refill  . hydrOXYzine (ATARAX/VISTARIL) 25 MG tablet Take 1 tablet (25 mg total) by mouth every 6 (six) hours as needed for anxiety. (Patient not taking: Reported on 03/19/2015) 30 tablet 0  . ibuprofen (ADVIL,MOTRIN)  600 MG tablet Take 1 tablet (600 mg total) by mouth every 6 (six) hours as needed. 30 tablet 0  . traZODone (DESYREL) 50 MG tablet Take 1 tablet (50 mg total) by mouth at bedtime and may repeat dose one time if needed. For insomnia and depression (Patient not taking: Reported on 03/19/2015) 30 tablet 0    Musculoskeletal: Strength & Muscle Tone: within normal limits Gait & Station: normal Patient leans: N/A  Psychiatric Specialty Exam: Physical Exam  Constitutional: He is oriented to person, place, and time. He appears well-developed and well-nourished.  HENT:  Head: Normocephalic and atraumatic.  Eyes: Pupils are equal, round, and reactive to light.  Neck: Normal range of motion.  Respiratory: Effort normal.  Musculoskeletal: Normal range of motion.  Neurological: He is alert and oriented to person, place, and time.  Skin: Skin is warm and dry.  Psychiatric: His speech is normal and behavior is normal. Judgment and thought content normal. His mood appears anxious. His affect is not angry. Cognition and memory are normal. He does not exhibit a depressed mood.    Review of Systems  Constitutional: Negative.   HENT: Negative.   Eyes: Negative.   Respiratory: Negative.   Cardiovascular: Negative.   Gastrointestinal: Negative.   Musculoskeletal: Negative.   Skin: Negative.   Psychiatric/Behavioral: Positive for substance abuse. Negative for depression, suicidal ideas, hallucinations and memory loss. The patient is nervous/anxious and has insomnia.     Blood pressure 122/83, pulse 65, temperature 98.1 F (36.7 C), temperature source Oral, resp. rate 18, height _0  (1.753 m), weight 73.483 kg (162 lb), SpO2 100 %.Body mass index is 23.91 kg/(m^2).  General Appearance: Negative  Eye Contact::  Good  Speech:  Clear and Coherent  Volume:  Normal  Mood:  Anxious  Affect:  Appropriate  Thought Process:  Negative  Orientation:  Full (Time, Place, and Person)  Thought Content:   Negative  Suicidal Thoughts:  No  Homicidal Thoughts:  No  Memory:  Immediate;   Good Recent;   Good Remote;   Good  Judgement:  Good  Insight:  Good  Psychomotor Activity:  Normal  Concentration:  Good  Recall:  Good  Fund of Knowledge:Good  Language: Good  Akathisia:  No  Handed:  Right  AIMS (if indicated):     Assets:  Desire for Improvement Financial Resources/Insurance Housing Intimacy Physical Health Social Support Talents/Skills  ADL's:  Intact  Cognition: WNL  Sleep:      Medical Decision Making: New problem, with additional work up planned, Review of Psycho-Social Stressors (1), Review or order clinical lab tests (1), Decision to obtain old records (1), Review and summation of old records (2) and Review of Medication Regimen & Side Effects (2)  Treatment Plan Summary: Plan Patient appears to have a little bit of racing thoughts but no obvious psychosis. Some of his history could be consistent with a hypomanic state. No evidence of acute dangerousness. Calm and cooperative now. Differential diagnosis includes the previously diagnosed adjustment disorder, substance-induced mood problems and possibly  bipolar disorder. I have a suspicion that he may ultimately proved to have a degree of bipolar disorder. At this point does not meet involuntary commitment criteria does not require hospitalization. Psychoeducation completed with the patient. Suggested that we start Seroquel 100 mg at night and that he follow-up with a provider in [facility]. We will make a referral to make sure he gets outpatient treatment. Patient agrees to the plan. Case discussed with emergency room doctor.  Plan:  Patient does not meet criteria for psychiatric inpatient admission. Supportive therapy provided about ongoing stressors. Discussed crisis plan, support from social network, calling 911, coming to the Emergency Department, and calling Suicide Hotline. Disposition: Patient will be released from  the hospital and can follow up in [facility].  Prescription for Seroquel 100 mg at night given.  CLAPACS, JOHN 04/14/2015 3:30 PM

## 2015-04-14 NOTE — ED Notes (Signed)
Pt to be discharged to home  Awaiting discharge paperwork to be completed

## 2015-09-25 ENCOUNTER — Encounter (HOSPITAL_COMMUNITY): Payer: Self-pay | Admitting: Emergency Medicine

## 2015-09-25 ENCOUNTER — Emergency Department (HOSPITAL_COMMUNITY)
Admission: EM | Admit: 2015-09-25 | Discharge: 2015-09-25 | Disposition: A | Discharge status: Home / Self Care | Payer: Self-pay | Attending: Emergency Medicine | Admitting: Emergency Medicine

## 2015-09-25 DIAGNOSIS — Z720 Tobacco use: Secondary | ICD-10-CM | POA: Insufficient documentation

## 2015-09-25 DIAGNOSIS — Y9389 Activity, other specified: Secondary | ICD-10-CM | POA: Insufficient documentation

## 2015-09-25 DIAGNOSIS — Z9889 Other specified postprocedural states: Secondary | ICD-10-CM | POA: Insufficient documentation

## 2015-09-25 DIAGNOSIS — Z0289 Encounter for other administrative examinations: Secondary | ICD-10-CM | POA: Insufficient documentation

## 2015-09-25 DIAGNOSIS — S93402A Sprain of unspecified ligament of left ankle, initial encounter: Secondary | ICD-10-CM

## 2015-09-25 DIAGNOSIS — S93492A Sprain of other ligament of left ankle, initial encounter: Secondary | ICD-10-CM | POA: Insufficient documentation

## 2015-09-25 DIAGNOSIS — X58XXXA Exposure to other specified factors, initial encounter: Secondary | ICD-10-CM | POA: Insufficient documentation

## 2015-09-25 DIAGNOSIS — Y998 Other external cause status: Secondary | ICD-10-CM | POA: Insufficient documentation

## 2015-09-25 DIAGNOSIS — Y9289 Other specified places as the place of occurrence of the external cause: Secondary | ICD-10-CM | POA: Insufficient documentation

## 2015-09-25 NOTE — ED Provider Notes (Signed)
CSN: [id number]     Arrival date & time 09/25/15  1417 History   First MD Initiated Contact with Patient 09/25/15 1426     Chief Complaint  Patient presents with  . Ankle Pain     (Consider location/radiation/quality/duration/timing/severity/associated sxs/prior Treatment) Patient is a 24 y.o. male presenting with ankle pain. The history is provided by the patient and medical records. No language interpreter was used.  Ankle Pain 24 yo AAM presents with ankle pain which began two days ago when he rolled his ankle. He describes the pain as throbbing and admits to swelling of the left ankle as well. Worse with movement and weight-bearing, alleviated with rest and pain medication. He was seen yesterday in the urgent care where he received x-rays and was told they were negative and he had an ankle sprain. He received pain medication as well. Today, he did not take the pain medication because he intended to go to work, however throughout the morning his pain intensified and he did not go to work.   History reviewed. No pertinent past medical history. Past Surgical History  Procedure Laterality Date  . Anterior cruciate ligament repair     No family history on file. Social History  Substance Use Topics  . Smoking status: Current Some Day Smoker  . Smokeless tobacco: Never Used  . Alcohol Use: Yes     Comment: Socially    Review of Systems  Constitutional: Negative.   Respiratory: Negative for chest tightness and shortness of breath.   Cardiovascular: Negative.   Musculoskeletal: Positive for joint swelling.  Skin: Negative.       Allergies  Peanut oil  Home Medications   Prior to Admission medications   Medication Sig Start Date End Date Taking? Authorizing Provider  hydrOXYzine (ATARAX/VISTARIL) 25 MG tablet Take 1 tablet (25 mg total) by mouth every 6 (six) hours as needed for anxiety. Patient not taking: Reported on 03/19/2015 10/02/14   Adonis Brook, NP  ibuprofen  (ADVIL,MOTRIN) 600 MG tablet Take 1 tablet (600 mg total) by mouth every 6 (six) hours as needed. 03/19/15   Arby Barrette, MD  traZODone (DESYREL) 50 MG tablet Take 1 tablet (50 mg total) by mouth at bedtime and may repeat dose one time if needed. For insomnia and depression Patient not taking: Reported on 03/19/2015 10/02/14   Adonis Brook, NP   BP 103/80 mmHg  Pulse 62  Temp(Src) 98 F (36.7 C) (Oral)  Resp 16  Ht  (1.753 m)  Wt 171 lb (77.565 kg)  BMI 25.24 kg/m2  SpO2 100% Physical Exam  Constitutional: He is oriented to person, place, and time. He appears well-developed and well-nourished.  HENT:  Head: Normocephalic and atraumatic.  Neck: Normal range of motion. Neck supple.  Cardiovascular: Normal rate, regular rhythm, normal heart sounds and intact distal pulses.  Exam reveals no gallop and no friction rub.   No murmur heard. Pulmonary/Chest: Effort normal and breath sounds normal. No respiratory distress. He has no wheezes. He has no rales. He exhibits no tenderness.  Abdominal: Soft. Bowel sounds are normal. He exhibits no distension and no mass. There is no tenderness. There is no rebound and no guarding.  Musculoskeletal:       Right shoulder: He exhibits decreased range of motion, tenderness and swelling. He exhibits normal strength.  Decreased ROM of left ankle secondary to pain Tenderness and swelling along the ATFL  No bruising.  5/5 muscle strength of left LE; neurovascularly intact  Neurological: He  is oriented to person, place, and time.  Skin: Skin is warm and dry. No rash noted. No erythema. No pallor.    ED Course  Procedures (including critical care time) Labs Review Labs Reviewed - No data to display  Imaging Review No results found. I have personally reviewed and evaluated these images and lab results as part of my medical decision-making.   EKG Interpretation None      MDM   Final diagnoses:  Ankle sprain, left, initial encounter    Douglass RiversShawn Ungar presents with ankle pain and recent visit to urgent care on 09/24/15 where he was x-rayed and diagnosed with an ankle sprain. Physical exam findings are consistent with an ankle sprain. Patient is requesting a work note for today which was given. He was also advised to rest, ice, and elevate the area.     [facility] HospitalJaime Pilcher Sukhdeep Wieting, PA-C 09/25/15 1524  Jerelyn ScottMartha Linker, MD 09/25/15 [phone]

## 2015-09-25 NOTE — ED Notes (Signed)
Pt from home for eval of pain to left ankle, pt injured ankle at work yesterday and [facility] Pt states xray were done and was sent home with brace and pain meds. Pt states pain is worse than yesterday, denies any new swelling. Pt ambulatory to fast track, states had to call out and was told needed proof that he couldn't go to work due to pain. Pulses present.

## 2015-09-25 NOTE — Discharge Instructions (Signed)
Return to clinic if worsening pain or any additional concerns.  Rest and elevate the leg, ice affected area for 30 minutes 3-4 times per day until pain resides.   Ankle Sprain An ankle sprain is an injury to the strong, fibrous tissues (ligaments) that hold the bones of your ankle joint together.  CAUSES An ankle sprain is usually caused by a fall or by twisting your ankle. Ankle sprains most commonly occur when you step on the outer edge of your foot, and your ankle turns inward. People who participate in sports are more prone to these types of injuries.  SYMPTOMS   Pain in your ankle. The pain may be present at rest or only when you are trying to stand or walk.  Swelling.  Bruising. Bruising may develop immediately or within 1 to 2 days after your injury.  Difficulty standing or walking, particularly when turning corners or changing directions. DIAGNOSIS  Your caregiver will ask you details about your injury and perform a physical exam of your ankle to determine if you have an ankle sprain. During the physical exam, your caregiver will press on and apply pressure to specific areas of your foot and ankle. Your caregiver will try to move your ankle in certain ways. An X-ray exam may be done to be sure a bone was not broken or a ligament did not separate from one of the bones in your ankle (avulsion fracture).  TREATMENT  Certain types of braces can help stabilize your ankle. Your caregiver can make a recommendation for this. Your caregiver may recommend the use of medicine for pain. If your sprain is severe, your caregiver may refer you to a surgeon who helps to restore function to parts of your skeletal system (orthopedist) or a physical therapist. HOME CARE INSTRUCTIONS   Apply ice to your injury for 1-2 days or as directed by your caregiver. Applying ice helps to reduce inflammation and pain.  Put ice in a plastic bag.  Place a towel between your skin and the bag.  Leave the ice on for  15-20 minutes at a time, every 2 hours while you are awake.  Only take over-the-counter or prescription medicines for pain, discomfort, or fever as directed by your caregiver.  Elevate your injured ankle above the level of your heart as much as possible for 2-3 days.  If your caregiver recommends crutches, use them as instructed. Gradually put weight on the affected ankle. Continue to use crutches or a cane until you can walk without feeling pain in your ankle.  If you have a plaster splint, wear the splint as directed by your caregiver. Do not rest it on anything harder than a pillow for the first 24 hours. Do not put weight on it. Do not get it wet. You may take it off to take a shower or bath.  You may have been given an elastic bandage to wear around your ankle to provide support. If the elastic bandage is too tight (you have numbness or tingling in your foot or your foot becomes cold and blue), adjust the bandage to make it comfortable.  If you have an air splint, you may blow more air into it or let air out to make it more comfortable. You may take your splint off at night and before taking a shower or bath. Wiggle your toes in the splint several times per day to decrease swelling. SEEK MEDICAL CARE IF:   You have rapidly increasing bruising or swelling.  Your  toes feel extremely cold or you lose feeling in your foot.  Your pain is not relieved with medicine. SEEK IMMEDIATE MEDICAL CARE IF:  Your toes are numb or blue.  You have severe pain that is increasing. MAKE SURE YOU:   Understand these instructions.  Will watch your condition.  Will get help right away if you are not doing well or get worse.   This information is not intended to replace advice given to you by your health care provider. Make sure you discuss any questions you have with your health care provider.   Document Released: 11/21/2005 Document Revised: 12/12/2014 Document Reviewed: 12/03/2011 Elsevier  Interactive Patient Education [employer]

## 2015-11-20 ENCOUNTER — Encounter (HOSPITAL_COMMUNITY): Payer: Self-pay | Admitting: Emergency Medicine

## 2015-11-20 ENCOUNTER — Emergency Department (HOSPITAL_COMMUNITY)
Admission: EM | Admit: 2015-11-20 | Discharge: 2015-11-20 | Disposition: A | Discharge status: Home / Self Care | Payer: BLUE CROSS/BLUE SHIELD | Attending: Emergency Medicine | Admitting: Emergency Medicine

## 2015-11-20 DIAGNOSIS — Z5689 Other problems related to employment: Secondary | ICD-10-CM

## 2015-11-20 DIAGNOSIS — F17200 Nicotine dependence, unspecified, uncomplicated: Secondary | ICD-10-CM | POA: Insufficient documentation

## 2015-11-20 DIAGNOSIS — Z8739 Personal history of other diseases of the musculoskeletal system and connective tissue: Secondary | ICD-10-CM | POA: Diagnosis not present

## 2015-11-20 DIAGNOSIS — Z0289 Encounter for other administrative examinations: Secondary | ICD-10-CM | POA: Insufficient documentation

## 2015-11-20 DIAGNOSIS — Z8659 Personal history of other mental and behavioral disorders: Secondary | ICD-10-CM | POA: Diagnosis not present

## 2015-11-20 HISTORY — DX: Bipolar disorder, unspecified: F31.9

## 2015-11-20 HISTORY — DX: Unspecified osteoarthritis, unspecified site: M19.90
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
## 2015-11-20 NOTE — ED Notes (Signed)
Adam Benton, Pharm Tech, spoke with patient.  Unable to verify all of patient's medication, speaking with NP at this time

## 2015-11-20 NOTE — ED Provider Notes (Signed)
CSN: [id number]     Arrival date & time 11/20/15  1723 History  By signing my name below, I, Lyndel Safe, attest that this documentation has been prepared under the direction and in the presence of Kerrie Buffalo, NP.  Electronically Signed: Lyndel Safe, ED Scribe. 11/20/2015. 5:52 PM.   Chief Complaint  Patient presents with  . Follow-up   The history is provided by the patient. No language interpreter was used.   HPI Comments: Kayson Tasker is a 24 y.o. male, with a hx of bipolar disorder and arthritis, who presents to the Emergency Department for a work note. 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 Pt takes trazodone, norco, and a bipolar medication daily, but he is unsure of the name of the mood stabilizing medication. He has no complaints today. Denies SI/HI. Pt afebrile.   Past Medical History  Diagnosis Date  . Bipolar 1 disorder (HCC)   . Arthritis    Past Surgical History  Procedure Laterality Date  . Anterior cruciate ligament repair     History reviewed. No pertinent family history. Social History  Substance Use Topics  . Smoking status: Current Some Day Smoker  . Smokeless tobacco: Never Used  . Alcohol Use: Yes     Comment: Socially    Review of Systems  Constitutional: Negative for fever.  Psychiatric/Behavioral: Negative for suicidal ideas and self-injury.  All other systems reviewed and are negative.  Allergies  Peanut oil  Home Medications   Prior to Admission medications   Medication Sig Start Date End Date Taking? Authorizing Provider  hydrOXYzine (ATARAX/VISTARIL) 25 MG tablet Take 1 tablet (25 mg total) by mouth every 6  (six) hours as needed for anxiety. Patient not taking: Reported on 03/19/2015 10/02/14   Adonis Brook, NP  ibuprofen (ADVIL,MOTRIN) 600 MG tablet Take 1 tablet (600 mg total) by mouth every 6 (six) hours as needed. Patient not taking: Reported on 11/20/2015 03/19/15   Arby Barrette, MD  traZODone (DESYREL) 50 MG tablet Take 1 tablet (50 mg total) by mouth at bedtime and may repeat dose one time if needed. For insomnia and depression Patient not taking: Reported on 03/19/2015 10/02/14   Adonis Brook, NP   BP 123/71 mmHg  Pulse 64  Temp(Src) 98.2 F (36.8 C) (Oral)  Resp 22  SpO2 100% Physical Exam  Constitutional: He is oriented to person, place, and time. He appears well-developed and well-nourished. No distress.  HENT:  Head: Normocephalic.  Eyes: Conjunctivae are normal.  Neck: Normal range of motion. Neck supple.  Cardiovascular: Normal rate.   Pulmonary/Chest: Effort normal. No respiratory distress.  Musculoskeletal: Normal range of motion.  Neurological: He is alert and oriented to person, place, and time. Coordination normal.  Skin: Skin is warm.  Psychiatric: He has a normal mood and affect. His behavior is normal.  Nursing note and vitals reviewed.   ED Course  Procedures  DIAGNOSTIC STUDIES: Oxygen Saturation is 100% on RA, normal by my interpretation.    COORDINATION OF CARE: 5:51 PM Discussed treatment plan which with pt. Pt acknowledges and agrees to plan.    MDM  24 y.o. male here for  work note because he states that his medications make him sleepy and he is supposed to drive tonight. Patient does not have his medications with him and the medications listed in his old chart are not the ones he is taking. I discussed with the patient that he will need to have the prescribing MD give him the note or bring the medications so we can know what they are. He voices understanding.   Final diagnoses:  Work-related condition   I personally performed the services  described in this documentation, which was scribed in my presence. The recorded information has been reviewed and is accurate.    AlmenaHope M Neese, NP 11/20/15 2053  Leta BaptistEmily Roe Nguyen, MD 11/21/15 [phone]

## 2015-11-20 NOTE — ED Notes (Signed)
Pt able to ambulate independently.  Instructed by NP to bring bottle to work if able to find that states "may cause drowsiness".

## 2015-11-20 NOTE — ED Notes (Signed)
Pt unsure of the names of the meds he is taking, and has multiple pharmacies.  Contacted Pharmacy and tech will be sent to review with patient/pharmacy.

## 2015-11-20 NOTE — ED Notes (Signed)
Encarnacion SlatesGreg, Pharm tech, made aware of need for medication reconciliation for treatment.    Patient also in hallway stating that he needs to leave for work soon.  This RN made patient aware that we need an accurate medication list in order to provide him with a note.  Patient using phone in room right now to call job, will let RN know his decision.

## 2015-11-20 NOTE — Discharge Instructions (Signed)
Take the medication bottle that you have that says causes drowsiness to your supervisor and show him. I am not sure what medications you are taking because they are not listed in our system.

## 2016-03-01 ENCOUNTER — Emergency Department (HOSPITAL_COMMUNITY): Payer: BLUE CROSS/BLUE SHIELD

## 2016-03-01 ENCOUNTER — Encounter (HOSPITAL_COMMUNITY): Payer: Self-pay | Admitting: Emergency Medicine

## 2016-03-01 ENCOUNTER — Emergency Department (HOSPITAL_COMMUNITY)
Admission: EM | Admit: 2016-03-01 | Discharge: 2016-03-01 | Disposition: A | Discharge status: Home / Self Care | Payer: BLUE CROSS/BLUE SHIELD | Attending: Emergency Medicine | Admitting: Emergency Medicine

## 2016-03-01 DIAGNOSIS — Y92410 Unspecified street and highway as the place of occurrence of the external cause: Secondary | ICD-10-CM | POA: Diagnosis not present

## 2016-03-01 DIAGNOSIS — M1990 Unspecified osteoarthritis, unspecified site: Secondary | ICD-10-CM | POA: Insufficient documentation

## 2016-03-01 DIAGNOSIS — S161XXA Strain of muscle, fascia and tendon at neck level, initial encounter: Secondary | ICD-10-CM

## 2016-03-01 DIAGNOSIS — F319 Bipolar disorder, unspecified: Secondary | ICD-10-CM | POA: Diagnosis not present

## 2016-03-01 DIAGNOSIS — F17200 Nicotine dependence, unspecified, uncomplicated: Secondary | ICD-10-CM | POA: Insufficient documentation

## 2016-03-01 DIAGNOSIS — Y9389 Activity, other specified: Secondary | ICD-10-CM | POA: Insufficient documentation

## 2016-03-01 DIAGNOSIS — Y998 Other external cause status: Secondary | ICD-10-CM | POA: Insufficient documentation

## 2016-03-01 DIAGNOSIS — S199XXA Unspecified injury of neck, initial encounter: Secondary | ICD-10-CM | POA: Diagnosis present

## 2016-03-01 MED ORDER — NAPROXEN 500 MG PO TABS: 500.0000 mg | ORAL_TABLET | Freq: Two times a day (BID) | ORAL | Status: AC

## 2016-03-01 MED ORDER — CYCLOBENZAPRINE HCL 10 MG PO TABS
10.0000 mg | ORAL_TABLET | Freq: Two times a day (BID) | ORAL | Status: AC | PRN
Start: 2016-03-01 — End: ?
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
## 2016-03-15 ENCOUNTER — Ambulatory Visit (HOSPITAL_COMMUNITY)
Admission: EM | Admit: 2016-03-15 | Discharge: 2016-03-15 | Disposition: A | Discharge status: Home / Self Care | Payer: BLUE CROSS/BLUE SHIELD | Attending: Family Medicine | Admitting: Family Medicine

## 2016-03-15 ENCOUNTER — Encounter (HOSPITAL_COMMUNITY): Payer: Self-pay | Admitting: Emergency Medicine

## 2016-03-15 DIAGNOSIS — R51 Headache: Secondary | ICD-10-CM

## 2016-03-15 DIAGNOSIS — R519 Headache, unspecified: Secondary | ICD-10-CM

## 2016-03-15 NOTE — ED Notes (Signed)
D/c by frank patrick, pa 

## 2016-03-15 NOTE — Discharge Instructions (Signed)

## 2016-03-15 NOTE — ED Notes (Signed)
Here for intermittent HA onset Sunday... Reports today's HA has been more constant; 7/10 Also reports lack of sleep... A&O x4... No acute distress.

## 2016-03-15 NOTE — ED Provider Notes (Signed)
CSN: [id number]     Arrival date & time 03/15/16  1301 History   First MD Initiated Contact with Patient 03/15/16 1325     Chief Complaint  Patient presents with  . Headache   (Consider location/radiation/quality/duration/timing/severity/associated sxs/prior Treatment) HPI Onset of headache a couple of days ago somewhat better with OTC meds. Hurts to move head. Works for [employer].  No history of migraine.  Past Medical History  Diagnosis Date  . Bipolar 1 disorder (HCC)   . Arthritis    Past Surgical History  Procedure Laterality Date  . Anterior cruciate ligament repair     No family history on file. Social History  Substance Use Topics  . Smoking status: Current Some Day Smoker  . Smokeless tobacco: Never Used  . Alcohol Use: Yes     Comment: Socially    Review of Systems headache Allergies  Peanut oil  Home Medications   Prior to Admission medications   Medication Sig Start Date End Date Taking? Authorizing Provider  cyclobenzaprine (FLEXERIL) 10 MG tablet Take 1 tablet (10 mg total) by mouth 2 (two) times daily as needed for muscle spasms. 03/01/16   Arthor CaptainAbigail Harris, PA-C  hydrOXYzine (ATARAX/VISTARIL) 25 MG tablet Take 1 tablet (25 mg total) by mouth every 6 (six) hours as needed for anxiety. Patient not taking: Reported on 03/19/2015 10/02/14   Adonis BrookSheila Agustin, NP  naproxen (NAPROSYN) 500 MG tablet Take 1 tablet (500 mg total) by mouth 2 (two) times daily. 03/01/16   Arthor CaptainAbigail Harris, PA-C  traZODone (DESYREL) 50 MG tablet Take 1 tablet (50 mg total) by mouth at bedtime and may repeat dose one time if needed. For insomnia and depression Patient not taking: Reported on 03/19/2015 10/02/14   Adonis BrookSheila Agustin, NP   Meds Ordered and Administered this Visit  Medications - No data to display  BP 114/68 mmHg  Pulse 64  Temp(Src) 98.4 F (36.9 C) (Oral)  Resp 12  SpO2 100% No data found.   Physical Exam NURSES NOTES AND VITAL SIGNS REVIEWED. CONSTITUTIONAL: Well developed,  well nourished, no acute distress HEENT: normocephalic, atraumatic, right and left TM's are normal EYES: Conjunctiva normal NECK:normal ROM, supple, no adenopathy PULMONARY:No respiratory distress, normal effort, Lungs: CTAb/l, no wheezes, or increased work of breathing CARDIOVASCULAR: RRR, no murmur ABDOMEN: soft, ND, NT, +'ve BS MUSCULOSKELETAL: Normal ROM of all extremities,  SKIN: warm and dry without rash PSYCHIATRIC: Mood and affect, behavior are normal  ED Course  Procedures (including critical care time)  Labs Review Labs Reviewed - No data to display  Imaging Review No results found.   Visual Acuity Review  Right Eye Distance:   Left Eye Distance:   Bilateral Distance:    Right Eye Near:   Left Eye Near:    Bilateral Near:     Work note provided.     MDM   1. Acute nonintractable headache, unspecified headache type     Patient is reassured that there are no issues that require transfer to higher level of care at this time or additional tests. Patient is advised to continue home symptomatic treatment. Patient is advised that if there are new or worsening symptoms to attend the emergency department, contact primary care provider, or return to UC. Instructions of care provided discharged home in stable condition.    THIS NOTE WAS GENERATED USING A VOICE RECOGNITION SOFTWARE PROGRAM. ALL REASONABLE EFFORTS  WERE MADE TO PROOFREAD THIS DOCUMENT FOR ACCURACY.  I have verbally reviewed the discharge instructions with the  patient. A printed AVS was given to the patient.  All questions were answered prior to discharge.      Tharon Aquas, PA 03/15/16 [phone]

## 2016-04-12 ENCOUNTER — Ambulatory Visit (INDEPENDENT_AMBULATORY_CARE_PROVIDER_SITE_OTHER): Payer: BLUE CROSS/BLUE SHIELD | Admitting: Family Medicine

## 2016-04-12 VITALS — BP 116/78 | HR 71 | Temp 98.4°F | Resp 16 | Ht 69.5 in | Wt 168.0 lb

## 2016-04-12 DIAGNOSIS — M25461 Effusion, right knee: Secondary | ICD-10-CM | POA: Diagnosis not present

## 2016-04-12 DIAGNOSIS — Z0000 Encounter for general adult medical examination without abnormal findings: Secondary | ICD-10-CM

## 2016-04-12 NOTE — Patient Instructions (Addendum)
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 However I also feel like it is important that you see an orthopedist so you can try and reduce the episodes. 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  I advised that you take naproxen (Aleve) 2 pills (440 mg) twice daily with food to try and keep the knee calm down. Ice it periodically and wear an Ace wrap to try and keep it from reaccumulating fluid.  We are making referral to the sports medicine physician to assess your knee further for you.  Return as needed    IF you received an x-ray today, you will receive an invoice from [facility]. Please contact [facility] at [phone] with questions or concerns regarding your invoice.   IF you received labwork today, you will receive an invoice from [facility]. Please contact [facility] at [phone] with questions or concerns regarding your invoice.   Our billing staff will not be able to assist you with questions regarding bills from these companies.  You will be contacted with the lab results as soon as they are available. The fastest way to get your results is to activate your My Chart account. Instructions are located on the last page of this paperwork. If you have not heard from us regarding the results in 2 weeks, please contact this office.

## 2016-04-12 NOTE — Progress Notes (Signed)
Subjective: Patient would like the fluid aspirated off of his right knee which keeps recurring. He is had done a number of times before, last time about a year ago. He does not think he is had any cortisone injection and it.  Objective: Moderately large effusion right knee palpable.  Procedure note  Using a lateral approach and sterile technique the area was anesthetized with 1% lidocaine. An 18-gauge needle was used to aspirate approximate 60 mL of fluid, slightly blood-tinged. The patient had jumped when the needle first was inserted, and probably cause the bleeding at that time. He felt much better after the procedure. He was instructed in its care  Assessment: Recurrent right knee effusion  Plan: Refer to sports medicine

## 2016-04-12 NOTE — Progress Notes (Signed)
Adam RiversShawn Wiegel male    DOB: 01/11/1991  Age: 25 y.o. MRN: [id number]  Chief Complaint  Patient presents with  . Annual Exam  . Wants fluid drawn off knee    Subjective:   Physical exam: Patient is here for a physical examination. 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 He is active, plays sports, but has had problems with a recurrent knee effusion over the years. He does well after it is aspirated, but then it eventually comes back. It is been about a year since he had to have it aspirated last. He had a torn meniscus many years ago when playing sports.  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 He is single. Not sexually involved. He has a male friend with him however today. He is a father of a 26-year-old child that he doesn't get to see often because he is in a custody battle.  Family history: Parents and brothers are healthy  Review of systems: Constitutional: Unremarkable HEENT: Unremarkable Cardiovascular: Unremarkable Respiratory: Unremarkable GI: Unremarkable GU: Unremarkable Muscular skeletal: Knee effusion is noted above Dermatologic: Unremarkable Neurologic: Unremarkable Psychiatric: Used to be on some psychotropic medications but no areas and does well Endocrinologic: Unremarkable      Current allergies, medications, problem list, past/family and social histories reviewed.  Objective:  BP 116/78 mmHg  Pulse 71  Temp(Src) 98.4 F (36.9 C) (Oral)  Resp 16  Ht 5' 9.5" (1.765 m)  Wt 168 lb (76.204 kg)  BMI 24.46 kg/m2  SpO2 99%  Healthy-appearing young man in no acute distress. HEENT normal with eyes PERRLA. EOMs intact. TMs normal. Throat clear. Teeth good. Neck supple without nodes or  family. No carotid bruits. Chest is clear to auscultation. Heart regular without murmurs, gallops, or arrhythmias. Abdomen soft without organomegaly masses or tenderness. Normal male external genitalia, circumcised, no hernias. Extremities unremarkable except for a significantly palpable effusion of the right knee which is nontender. Spine normal. Skin normal. He says he gets some aches and pains in his fingers also but they do not look deformed.  Assessment & Plan:   Assessment: 1. Physical exam   2. 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 I told him that although he can take the test to see if he gets the job. I felt like it is very important for him to get his knee taken care of so that it would not swell and compromise him at anytime during the job. He understands that. See instructions.  Orders Placed This Encounter  Procedures  . Synovial cell count + diff, w/ crystals  . Ambulatory referral to Orthopedic Surgery    Referral Priority:  Routine    Referral Type:  Surgical    Referral Reason:  Specialty Services Required    Requested Specialty:  Orthopedic Surgery    Number of Visits Requested:  1    No orders of the defined types were placed in this encounter.         Patient Instructions   I do believe that you are safe to take the firefighter testing, especially now that  we have fluid from your knee. However I also feel like it is important that you see an orthopedist so you can try and reduce the episodes. 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  I advised that you take naproxen (Aleve) 2 pills (440 mg) twice daily with food to try and keep the knee calm down. Ice it periodically and wear an Ace wrap to try and keep it from reaccumulating fluid.  We are making referral to the  sports medicine physician to assess your knee further for you.  Return as needed    IF you received an x-ray today, you will receive an invoice from [facility]. Please contact [facility] at [phone] with questions or concerns regarding your invoice.   IF you received labwork today, you will receive an invoice from [facility]. Please contact [facility] at [phone] with questions or concerns regarding your invoice.   Our billing staff will not be able to assist you with questions regarding bills from these companies.  You will be contacted with the lab results as soon as they are available. The fastest way to get your results is to activate your My Chart account. Instructions are located on the last page of this paperwork. If you have not heard from [place] regarding the results in 2 weeks, please contact this office.          Return if symptoms worsen or fail to improve.   HOPPER,DAVID, MD 04/12/2016

## 2016-04-14 LAB — SYNOVIAL CELL COUNT + DIFF, W/ CRYSTALS
BASOPHILS, %: 0 %
EOSINOPHILS-SYNOVIAL: 0 % (ref 0–2)
Lymphocytes-Synovial Fld: 15 % (ref 0–74)
Monocyte/Macrophage: 32 % (ref 0–69)
Neutrophil, Synovial: 53 % — ABNORMAL HIGH (ref 0–24)
Synoviocytes, %: 0 % (ref 0–15)
WBC, Synovial: 565 cells/uL — ABNORMAL HIGH (ref <150)
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
